# Patient Record
Sex: Female | Born: 1975
Health system: Southern US, Community
[De-identification: ages and names within clinical notes are randomized; demographics above are authoritative.]

## PROBLEM LIST (undated history)

## (undated) DIAGNOSIS — K219 Gastro-esophageal reflux disease without esophagitis: Secondary | ICD-10-CM

## (undated) DIAGNOSIS — T7840XA Allergy, unspecified, initial encounter: Secondary | ICD-10-CM

## (undated) DIAGNOSIS — G4700 Insomnia, unspecified: Secondary | ICD-10-CM

## (undated) DIAGNOSIS — R519 Headache, unspecified: Secondary | ICD-10-CM

## (undated) DIAGNOSIS — R011 Cardiac murmur, unspecified: Secondary | ICD-10-CM

## (undated) DIAGNOSIS — F419 Anxiety disorder, unspecified: Secondary | ICD-10-CM

## (undated) HISTORY — DX: Insomnia, unspecified: G47.00

## (undated) HISTORY — DX: Allergy, unspecified, initial encounter: T78.40XA

## (undated) HISTORY — DX: Anxiety disorder, unspecified: F41.9

---

## 2012-06-21 DIAGNOSIS — G4700 Insomnia, unspecified: Secondary | ICD-10-CM

## 2012-06-21 DIAGNOSIS — F419 Anxiety disorder, unspecified: Secondary | ICD-10-CM

## 2012-06-21 HISTORY — DX: Anxiety disorder, unspecified: F41.9

## 2012-06-21 HISTORY — DX: Insomnia, unspecified: G47.00

## 2016-10-04 ENCOUNTER — Ambulatory Visit (INDEPENDENT_AMBULATORY_CARE_PROVIDER_SITE_OTHER): Payer: 59 | Admitting: Internal Medicine

## 2016-10-04 ENCOUNTER — Encounter: Payer: Self-pay | Admitting: Internal Medicine

## 2016-10-04 VITALS — BP 128/62 | HR 96 | Ht 64.96 in | Wt 250.2 lb

## 2016-10-04 DIAGNOSIS — R0789 Other chest pain: Secondary | ICD-10-CM

## 2016-10-04 DIAGNOSIS — Z1231 Encounter for screening mammogram for malignant neoplasm of breast: Secondary | ICD-10-CM | POA: Diagnosis not present

## 2016-10-04 DIAGNOSIS — N920 Excessive and frequent menstruation with regular cycle: Secondary | ICD-10-CM

## 2016-10-04 DIAGNOSIS — J3089 Other allergic rhinitis: Secondary | ICD-10-CM

## 2016-10-04 DIAGNOSIS — Z1239 Encounter for other screening for malignant neoplasm of breast: Secondary | ICD-10-CM

## 2016-10-04 NOTE — Patient Instructions (Signed)
Breast Self-Awareness Breast self-awareness means being familiar with how your breasts look and feel. It involves checking your breasts regularly and reporting any changes to your health care provider. Practicing breast self-awareness is important. A change in your breasts can be a sign of a serious medical problem. Being familiar with how your breasts look and feel allows you to find any problems early, when treatment is more likely to be successful. All women should practice breast self-awareness, including women who have had breast implants. How to do a breast self-exam One way to learn what is normal for your breasts and whether your breasts are changing is to do a breast self-exam. To do a breast self-exam: Look for Changes   1. Remove all the clothing above your waist. 2. Stand in front of a mirror in a room with good lighting. 3. Put your hands on your hips. 4. Push your hands firmly downward. 5. Compare your breasts in the mirror. Look for differences between them (asymmetry), such as:  Differences in shape.  Differences in size.  Puckers, dips, and bumps in one breast and not the other. 6. Look at each breast for changes in your skin, such as:  Redness.  Scaly areas. 7. Look for changes in your nipples, such as:  Discharge.  Bleeding.  Dimpling.  Redness.  A change in position. Feel for Changes   Carefully feel your breasts for lumps and changes. It is best to do this while lying on your back on the floor and again while sitting or standing in the shower or tub with soapy water on your skin. Feel each breast in the following way:  Place the arm on the side of the breast you are examining above your head.  Feel your breast with the other hand.  Start in the nipple area and make  inch (2 cm) overlapping circles to feel your breast. Use the pads of your three middle fingers to do this. Apply light pressure, then medium pressure, then firm pressure. The light pressure  will allow you to feel the tissue closest to the skin. The medium pressure will allow you to feel the tissue that is a little deeper. The firm pressure will allow you to feel the tissue close to the ribs.  Continue the overlapping circles, moving downward over the breast until you feel your ribs below your breast.  Move one finger-width toward the center of the body. Continue to use the  inch (2 cm) overlapping circles to feel your breast as you move slowly up toward your collarbone.  Continue the up and down exam using all three pressures until you reach your armpit. Write Down What You Find   Write down what is normal for each breast and any changes that you find. Keep a written record with breast changes or normal findings for each breast. By writing this information down, you do not need to depend only on memory for size, tenderness, or location. Write down where you are in your menstrual cycle, if you are still menstruating. If you are having trouble noticing differences in your breasts, do not get discouraged. With time you will become more familiar with the variations in your breasts and more comfortable with the exam. How often should I examine my breasts? Examine your breasts every month. If you are breastfeeding, the best time to examine your breasts is after a feeding or after using a breast pump. If you menstruate, the best time to examine your breasts is 5-7 days  after your period is over. During your period, your breasts are lumpier, and it may be more difficult to notice changes. When should I see my health care provider? See your health care provider if you notice:  A change in shape or size of your breasts or nipples.  A change in the skin of your breast or nipples, such as a reddened or scaly area.  Unusual discharge from your nipples.  A lump or thick area that was not there before.  Pain in your breasts.  Anything that concerns you. This information is not intended to  replace advice given to you by your health care provider. Make sure you discuss any questions you have with your health care provider. Document Released: 06/07/2005 Document Revised: 11/13/2015 Document Reviewed: 04/27/2015 Elsevier Interactive Patient Education  2017 [employer].

## 2016-10-04 NOTE — Progress Notes (Signed)
Date:  10/04/2016   Name:  Kathryn George   DOB:  [birth year]   MRN:  [id number]   Chief Complaint: [place] (Moved here from [place] 2 years ago. Needs PCP.) and Chest Pain (2 weeks ago this started. Wakes up in middle of night with chest tightness, SOB, and feels like "somebody punched me in my chest. I feel startled." Throat burns in middle of night.  ) Chest Pain   This is a new problem. The current episode started 1 to 4 weeks ago. The onset quality is sudden. The problem has been resolved. The pain is present in the substernal region. The pain is moderate. The quality of the pain is described as crushing. The pain does not radiate. Associated symptoms include shortness of breath. Pertinent negatives include no abdominal pain, cough, diaphoresis, dizziness, exertional chest pressure, fever, headaches, irregular heartbeat, palpitations or vomiting. Associated with: lying down. She has tried nothing for the symptoms.  Her family medical history is significant for diabetes.  Pertinent negatives for family medical history include: no CAD.  Menstrual change - recently having heavier menses with some clots.  Menses are still mostly regular but she has not kept a calendar.  Bleeding lasts 5 days as usual.  No currently sexually active - no concerned about birth control.    Review of Systems  Constitutional: Negative for diaphoresis and fever.  Eyes: Negative for visual disturbance.  Respiratory: Positive for shortness of breath. Negative for cough.   Cardiovascular: Positive for chest pain. Negative for palpitations.  Gastrointestinal: Negative for abdominal pain, diarrhea and vomiting.  Genitourinary: Positive for menstrual problem. Negative for dysuria.  Musculoskeletal: Negative for arthralgias and gait problem.  Skin: Negative for color change and rash.  Neurological: Negative for dizziness and headaches.    There are no active problems to display for this patient.   Prior to  Admission medications   Medication Sig Start Date End Date Taking? Authorizing Provider  Naproxen Sod-Diphenhydramine (ALEVE PM PO) Take by mouth.   Yes Historical Provider, MD    No Known Allergies  History reviewed. No pertinent surgical history.  Social History  Substance Use Topics  . Smoking status: Current Every Day Smoker    Types: Cigars  . Smokeless tobacco: Never Used     Comment: started cigars 2 years ago.  . Alcohol use Yes     Medication list has been reviewed and updated.   Physical Exam  Constitutional: She is oriented to person, place, and time. She appears well-developed. No distress.  HENT:  Head: Normocephalic and atraumatic.  Right Ear: Tympanic membrane and ear canal normal.  Left Ear: Tympanic membrane and ear canal normal.  Nose: Right sinus exhibits no maxillary sinus tenderness. Left sinus exhibits no maxillary sinus tenderness.  Mouth/Throat: No posterior oropharyngeal erythema.  Eyes: Conjunctivae and EOM are normal. Pupils are equal, round, and reactive to light.  Neck: Normal range of motion. Carotid bruit is not present. No thyromegaly present.  Cardiovascular: Normal rate, regular rhythm, normal heart sounds and intact distal pulses.   Pulmonary/Chest: Effort normal and breath sounds normal. No respiratory distress. She has no wheezes.  Abdominal: Soft. Bowel sounds are normal. There is no tenderness.  Musculoskeletal: She exhibits no edema.  Neurological: She is alert and oriented to person, place, and time. She has normal reflexes.  Skin: Skin is warm and dry. No rash noted.  Psychiatric: She has a normal mood and affect. Her behavior is normal. Thought content normal.  Nursing note and vitals reviewed.   BP 128/62 (BP Location: Right Arm, Patient Position: Sitting, Cuff Size: Large)   Pulse 96   Ht 5' 4.96" (1.65 m)   Wt 250 lb 3.2 oz (113.5 kg)   LMP 09/25/2016 (Exact Date)   SpO2 99%   BMI 41.69 kg/m   Assessment and Plan: 1.  Atypical chest pain Suspect GERD Anti-reflux precautions given  Take 14 day course of Prilosec if sx recur  2. Menorrhagia with regular cycle Likely peri-menopausal Pap done last year was normal by report  3. Breast cancer screening - MM DIGITAL SCREENING BILATERAL; Future  4. Environmental and seasonal allergies Continue Claritin or Zyrtec   No orders of the defined types were placed in this encounter.   Halina Maidens, MD [facility]  10/04/2016

## 2017-04-11 ENCOUNTER — Encounter: Payer: 59 | Admitting: Internal Medicine

## 2017-04-15 ENCOUNTER — Encounter: Payer: Self-pay | Admitting: Internal Medicine

## 2017-04-15 ENCOUNTER — Ambulatory Visit (INDEPENDENT_AMBULATORY_CARE_PROVIDER_SITE_OTHER): Payer: 59 | Admitting: Internal Medicine

## 2017-04-15 VITALS — BP 104/78 | HR 67 | Ht 64.9 in | Wt 252.0 lb

## 2017-04-15 DIAGNOSIS — Z0000 Encounter for general adult medical examination without abnormal findings: Secondary | ICD-10-CM | POA: Diagnosis not present

## 2017-04-15 DIAGNOSIS — Z1231 Encounter for screening mammogram for malignant neoplasm of breast: Secondary | ICD-10-CM

## 2017-04-15 DIAGNOSIS — E559 Vitamin D deficiency, unspecified: Secondary | ICD-10-CM

## 2017-04-15 DIAGNOSIS — N920 Excessive and frequent menstruation with regular cycle: Secondary | ICD-10-CM | POA: Diagnosis not present

## 2017-04-15 DIAGNOSIS — Z1239 Encounter for other screening for malignant neoplasm of breast: Secondary | ICD-10-CM

## 2017-04-15 LAB — POCT URINALYSIS DIPSTICK
BILIRUBIN UA: NEGATIVE
GLUCOSE UA: NEGATIVE
Ketones, UA: NEGATIVE
Leukocytes, UA: NEGATIVE
Nitrite, UA: NEGATIVE
Protein, UA: 0.2
SPEC GRAV UA: 1.015 (ref 1.010–1.025)
Urobilinogen, UA: 0.2 E.U./dL
pH, UA: 7 (ref 5.0–8.0)

## 2017-04-15 NOTE — Patient Instructions (Signed)
DASH Eating Plan - although this is for high BP it is an excellent diet in general DASH stands for "Dietary Approaches to Stop Hypertension." The DASH eating plan is a healthy eating plan that has been shown to reduce high blood pressure (hypertension). It may also reduce your risk for type 2 diabetes, heart disease, and stroke. The DASH eating plan may also help with weight loss. What are tips for following this plan? General guidelines  Avoid eating more than 2,300 mg (milligrams) of salt (sodium) a day. If you have hypertension, you may need to reduce your sodium intake to 1,500 mg a day.  Limit alcohol intake to no more than 1 drink a day for nonpregnant women and 2 drinks a day for men. One drink equals 12 oz of beer, 5 oz of wine, or 1 oz of hard liquor.  Work with your health care provider to maintain a healthy body weight or to lose weight. Ask what an ideal weight is for you.  Get at least 30 minutes of exercise that causes your heart to beat faster (aerobic exercise) most days of the week. Activities may include walking, swimming, or biking.  Work with your health care provider or diet and nutrition specialist (dietitian) to adjust your eating plan to your individual calorie needs. Reading food labels  Check food labels for the amount of sodium per serving. Choose foods with less than 5 percent of the Daily Value of sodium. Generally, foods with less than 300 mg of sodium per serving fit into this eating plan.  To find whole grains, look for the word "whole" as the first word in the ingredient list. Shopping  Buy products labeled as "low-sodium" or "no salt added."  Buy fresh foods. Avoid canned foods and premade or frozen meals. Cooking  Avoid adding salt when cooking. Use salt-free seasonings or herbs instead of table salt or sea salt. Check with your health care provider or pharmacist before using salt substitutes.  Do not fry foods. Cook foods using healthy methods such as  baking, boiling, grilling, and broiling instead.  Cook with heart-healthy oils, such as olive, canola, soybean, or sunflower oil. Meal planning   Eat a balanced diet that includes: ? 5 or more servings of fruits and vegetables each day. At each meal, try to fill half of your plate with fruits and vegetables. ? Up to 6-8 servings of whole grains each day. ? Less than 6 oz of lean meat, poultry, or fish each day. A 3-oz serving of meat is about the same size as a deck of cards. One egg equals 1 oz. ? 2 servings of low-fat dairy each day. ? A serving of nuts, seeds, or beans 5 times each week. ? Heart-healthy fats. Healthy fats called Omega-3 fatty acids are found in foods such as flaxseeds and coldwater fish, like sardines, salmon, and mackerel.  Limit how much you eat of the following: ? Canned or prepackaged foods. ? Food that is high in trans fat, such as fried foods. ? Food that is high in saturated fat, such as fatty meat. ? Sweets, desserts, sugary drinks, and other foods with added sugar. ? Full-fat dairy products.  Do not salt foods before eating.  Try to eat at least 2 vegetarian meals each week.  Eat more home-cooked food and less restaurant, buffet, and fast food.  When eating at a restaurant, ask that your food be prepared with less salt or no salt, if possible. What foods are recommended? The  items listed may not be a complete list. Talk with your dietitian about what dietary choices are best for you. Grains Whole-grain or whole-wheat bread. Whole-grain or whole-wheat pasta. Brown rice. [place]. Bulgur. Whole-grain and low-sodium cereals. Pita bread. Low-fat, low-sodium crackers. Whole-wheat flour tortillas. Vegetables Fresh or frozen vegetables (raw, steamed, roasted, or grilled). Low-sodium or reduced-sodium tomato and vegetable juice. Low-sodium or reduced-sodium tomato sauce and tomato paste. Low-sodium or reduced-sodium canned vegetables. Fruits All fresh,  dried, or frozen fruit. Canned fruit in natural juice (without added sugar). Meat and other protein foods Skinless chicken or [place]. Ground chicken or [place]. Pork with fat trimmed off. Fish and seafood. Egg whites. Dried beans, peas, or lentils. Unsalted nuts, nut butters, and seeds. Unsalted canned beans. Lean cuts of beef with fat trimmed off. Low-sodium, lean deli meat. Dairy Low-fat (1%) or fat-free (skim) milk. Fat-free, low-fat, or reduced-fat cheeses. Nonfat, low-sodium ricotta or cottage cheese. Low-fat or nonfat yogurt. Low-fat, low-sodium cheese. Fats and oils Soft margarine without trans fats. Vegetable oil. Low-fat, reduced-fat, or light mayonnaise and salad dressings (reduced-sodium). Canola, safflower, olive, soybean, and sunflower oils. Avocado. Seasoning and other foods Herbs. Spices. Seasoning mixes without salt. Unsalted popcorn and pretzels. Fat-free sweets. What foods are not recommended? The items listed may not be a complete list. Talk with your dietitian about what dietary choices are best for you. Grains Baked goods made with fat, such as croissants, muffins, or some breads. Dry pasta or rice meal packs. Vegetables Creamed or fried vegetables. Vegetables in a cheese sauce. Regular canned vegetables (not low-sodium or reduced-sodium). Regular canned tomato sauce and paste (not low-sodium or reduced-sodium). Regular tomato and vegetable juice (not low-sodium or reduced-sodium). Angie Fava. Olives. Fruits Canned fruit in a light or heavy syrup. Fried fruit. Fruit in cream or butter sauce. Meat and other protein foods Fatty cuts of meat. Ribs. Fried meat. Berniece Salines. Sausage. Bologna and other processed lunch meats. Salami. Fatback. Hotdogs. Bratwurst. Salted nuts and seeds. Canned beans with added salt. Canned or smoked fish. Whole eggs or egg yolks. Chicken or [place] with skin. Dairy Whole or 2% milk, cream, and half-and-half. Whole or full-fat cream cheese. Whole-fat or sweetened  yogurt. Full-fat cheese. Nondairy creamers. Whipped toppings. Processed cheese and cheese spreads. Fats and oils Butter. Stick margarine. Lard. Shortening. Ghee. Bacon fat. Tropical oils, such as coconut, palm kernel, or palm oil. Seasoning and other foods Salted popcorn and pretzels. Onion salt, garlic salt, seasoned salt, table salt, and sea salt. Worcestershire sauce. Tartar sauce. Barbecue sauce. Teriyaki sauce. Soy sauce, including reduced-sodium. Steak sauce. Canned and packaged gravies. Fish sauce. Oyster sauce. Cocktail sauce. Horseradish that you find on the shelf. Ketchup. Mustard. Meat flavorings and tenderizers. Bouillon cubes. Hot sauce and Tabasco sauce. Premade or packaged marinades. Premade or packaged taco seasonings. Relishes. Regular salad dressings. Where to find more information:  National Heart, Lung, and [place]: https://wilson-eaton.com/  American Heart Association: www.heart.org Summary  The DASH eating plan is a healthy eating plan that has been shown to reduce high blood pressure (hypertension). It may also reduce your risk for type 2 diabetes, heart disease, and stroke.  With the DASH eating plan, you should limit salt (sodium) intake to 2,300 mg a day. If you have hypertension, you may need to reduce your sodium intake to 1,500 mg a day.  When on the DASH eating plan, aim to eat more fresh fruits and vegetables, whole grains, lean proteins, low-fat dairy, and heart-healthy fats.  Work with your health care provider  or diet and nutrition specialist (dietitian) to adjust your eating plan to your individual calorie needs. This information is not intended to replace advice given to you by your health care provider. Make sure you discuss any questions you have with your health care provider. Document Released: 05/27/2011 Document Revised: 05/31/2016 Document Reviewed: 05/31/2016 Elsevier Interactive Patient Education  2017 [employer].

## 2017-04-15 NOTE — Progress Notes (Signed)
Date:  04/15/2017   Name:  Kathryn George   DOB:  [birth year]   MRN:  [id number]   Chief Complaint: Annual Exam (Breast Exam. ) Kathryn George is a 41 y.o. female who presents today for her Complete Annual Exam. She feels well. She reports exercising regularly. She reports she is sleeping well. Her menses are still fairly regular but somewhat heavier and slightly longer - up to 7 days. Mammogram was done in April. Patient reports a history of vitamin D deficiency but has not taken supplements in over a year.  She has a history of mild anemia in the past but none recently.  EXAM: MAMMO SCREENING BILATERAL DATE: 10/15/2016 11:48 AM ACCESSION: [id number] UN DICTATED: 10/15/2016 9:58 PM INTERPRETATION LOCATION: [place]  CLINICAL INDICATION: 41 years old Female: SCREENING MAMMOGRAM-Z12.31-Visit for screening mammogram  TECHNIQUE: Full-field digital mammography with MLO and CC views  COMPARISON: 2016  BREAST DENSITY: b - There are scattered areas of fibroglandular density.  FINDINGS: There are no suspicious masses, malignant calcifications, sites of architectural distortion, or concerning asymmetries in either breast.  ASSESSMENT:  BI-RADS Category: 1-Mammo1Yr : Negative.Mammogram 1 year follow-up.   Recommendation Laterality: Both  Annual routine screening mammography.  Review of Systems  Constitutional: Negative for chills, fatigue and fever.  HENT: Negative for congestion, hearing loss, tinnitus, trouble swallowing and voice change.   Eyes: Negative for visual disturbance.  Respiratory: Negative for cough, chest tightness, shortness of breath and wheezing.   Cardiovascular: Negative for chest pain, palpitations and leg swelling.  Gastrointestinal: Negative for abdominal pain, constipation, diarrhea and vomiting.       Intermittent gerd  Endocrine: Negative for polydipsia and polyuria.  Genitourinary: Negative for dysuria, frequency, genital sores, vaginal bleeding and  vaginal discharge.  Musculoskeletal: Negative for arthralgias, gait problem and joint swelling.  Skin: Negative for color change and rash.  Neurological: Negative for dizziness, tremors, light-headedness and headaches.  Hematological: Negative for adenopathy. Does not bruise/bleed easily.  Psychiatric/Behavioral: Negative for dysphoric mood and sleep disturbance. The patient is not nervous/anxious.     Patient Active Problem List   Diagnosis Date Noted  . Atypical chest pain 10/04/2016  . Menorrhagia with regular cycle 10/04/2016  . Environmental and seasonal allergies 10/04/2016    Prior to Admission medications   Medication Sig Start Date End Date Taking? Authorizing Provider  desonide (DESOWEN) 0.05 % cream Apply topically 2 (two) times daily.   Yes [provider]  Naproxen Sod-Diphenhydramine (ALEVE PM PO) Take by mouth.   Yes [provider]    No Known Allergies  History reviewed. No pertinent surgical history.  Social History  Substance Use Topics  . Smoking status: Current Every Day Smoker    Types: Cigars  . Smokeless tobacco: Never Used     Comment: started cigars 2 years ago.  . Alcohol use Yes     Medication list has been reviewed and updated.  PHQ 2/9 Scores 04/15/2017  PHQ - 2 Score 0    Physical Exam  Constitutional: She is oriented to person, place, and time. She appears well-developed and well-nourished. No distress.  HENT:  Head: Normocephalic and atraumatic.  Right Ear: Tympanic membrane and ear canal normal.  Left Ear: Tympanic membrane and ear canal normal.  Nose: Right sinus exhibits no maxillary sinus tenderness. Left sinus exhibits no maxillary sinus tenderness.  Mouth/Throat: Uvula is midline and oropharynx is clear and moist.  Eyes: Conjunctivae and EOM are normal. Right eye exhibits no discharge. Left eye exhibits  no discharge. No scleral icterus.  Neck: Normal range of motion. Carotid bruit is not present. No erythema  present. No thyromegaly present.  Cardiovascular: Normal rate, regular rhythm, normal heart sounds and normal pulses.   Pulmonary/Chest: Effort normal. No respiratory distress. She has no wheezes. Right breast exhibits no mass, no nipple discharge, no skin change and no tenderness. Left breast exhibits no mass, no nipple discharge, no skin change and no tenderness.  Abdominal: Soft. Bowel sounds are normal. There is no hepatosplenomegaly. There is no tenderness. There is no CVA tenderness.  Musculoskeletal: Normal range of motion.  Lymphadenopathy:    She has no cervical adenopathy.    She has no axillary adenopathy.  Neurological: She is alert and oriented to person, place, and time. She has normal reflexes. No cranial nerve deficit or sensory deficit.  Skin: Skin is warm, dry and intact. No rash noted.  Psychiatric: She has a normal mood and affect. Her speech is normal and behavior is normal. Thought content normal.  Nursing note and vitals reviewed.   BP 104/78   Pulse 67   Ht 5' 4.9" (1.648 m)   Wt 252 lb (114.3 kg)   LMP 04/10/2017 (Exact Date)   SpO2 98%   BMI 42.06 kg/m   Assessment and Plan: 1. Annual physical exam Normal exam except for weight - work on healthy diet and exercise - Comprehensive metabolic panel - Lipid panel - TSH - POCT urinalysis dipstick  2. Breast cancer screening Completed with normal exam  3. Menorrhagia with regular cycle Pap done 2017 - CBC with Differential/Platelet  4. Vitamin D deficiency May need to resume supplements - VITAMIN D 25 Hydroxy (Vit-D Deficiency, Fractures)   No orders of the defined types were placed in this encounter.   Partially dictated using Editor, commissioning. Any errors are unintentional.  Halina Maidens, MD [facility]  04/15/2017

## 2017-04-16 LAB — CBC WITH DIFFERENTIAL/PLATELET
BASOS ABS: 0.1 10*3/uL (ref 0.0–0.2)
BASOS: 1 %
EOS (ABSOLUTE): 0.2 10*3/uL (ref 0.0–0.4)
EOS: 3 %
HEMATOCRIT: 35.7 % (ref 34.0–46.6)
HEMOGLOBIN: 11.3 g/dL (ref 11.1–15.9)
IMMATURE GRANS (ABS): 0 10*3/uL (ref 0.0–0.1)
Immature Granulocytes: 0 %
LYMPHS: 31 %
Lymphocytes Absolute: 1.8 10*3/uL (ref 0.7–3.1)
MCH: 23.7 pg — AB (ref 26.6–33.0)
MCHC: 31.7 g/dL (ref 31.5–35.7)
MCV: 75 fL — ABNORMAL LOW (ref 79–97)
MONOCYTES: 12 %
Monocytes Absolute: 0.7 10*3/uL (ref 0.1–0.9)
NEUTROS ABS: 3.2 10*3/uL (ref 1.4–7.0)
NEUTROS PCT: 53 %
Platelets: 369 10*3/uL (ref 150–379)
RBC: 4.77 x10E6/uL (ref 3.77–5.28)
RDW: 15.7 % — ABNORMAL HIGH (ref 12.3–15.4)
WBC: 5.9 10*3/uL (ref 3.4–10.8)

## 2017-04-16 LAB — COMPREHENSIVE METABOLIC PANEL
ALBUMIN: 4 g/dL (ref 3.5–5.5)
ALT: 17 IU/L (ref 0–32)
AST: 16 IU/L (ref 0–40)
Albumin/Globulin Ratio: 1.2 (ref 1.2–2.2)
Alkaline Phosphatase: 71 IU/L (ref 39–117)
BILIRUBIN TOTAL: 0.2 mg/dL (ref 0.0–1.2)
BUN / CREAT RATIO: 15 (ref 9–23)
BUN: 12 mg/dL (ref 6–24)
CO2: 23 mmol/L (ref 20–29)
CREATININE: 0.8 mg/dL (ref 0.57–1.00)
Calcium: 9 mg/dL (ref 8.7–10.2)
Chloride: 100 mmol/L (ref 96–106)
GFR calc non Af Amer: 91 mL/min/{1.73_m2} (ref >59)
GFR, EST AFRICAN AMERICAN: 104 mL/min/{1.73_m2} (ref >59)
GLOBULIN, TOTAL: 3.4 g/dL (ref 1.5–4.5)
GLUCOSE: 79 mg/dL (ref 65–99)
Potassium: 4.4 mmol/L (ref 3.5–5.2)
SODIUM: 136 mmol/L (ref 134–144)
TOTAL PROTEIN: 7.4 g/dL (ref 6.0–8.5)

## 2017-04-16 LAB — LIPID PANEL
Chol/HDL Ratio: 4.3 ratio (ref 0.0–4.4)
Cholesterol, Total: 233 mg/dL — ABNORMAL HIGH (ref 100–199)
HDL: 54 mg/dL (ref >39)
LDL CALC: 141 mg/dL — AB (ref 0–99)
Triglycerides: 188 mg/dL — ABNORMAL HIGH (ref 0–149)
VLDL CHOLESTEROL CAL: 38 mg/dL (ref 5–40)

## 2017-04-16 LAB — TSH: TSH: 0.675 u[IU]/mL (ref 0.450–4.500)

## 2017-04-16 LAB — VITAMIN D 25 HYDROXY (VIT D DEFICIENCY, FRACTURES): VIT D 25 HYDROXY: 16.4 ng/mL — AB (ref 30.0–100.0)

## 2017-04-17 ENCOUNTER — Encounter: Payer: Self-pay | Admitting: Internal Medicine

## 2017-04-17 DIAGNOSIS — E782 Mixed hyperlipidemia: Secondary | ICD-10-CM | POA: Insufficient documentation

## 2017-11-10 ENCOUNTER — Encounter: Payer: 59 | Admitting: Internal Medicine

## 2017-12-09 ENCOUNTER — Encounter: Payer: Self-pay | Admitting: Internal Medicine

## 2018-08-25 ENCOUNTER — Encounter: Payer: Self-pay | Admitting: Internal Medicine

## 2018-08-25 ENCOUNTER — Ambulatory Visit (INDEPENDENT_AMBULATORY_CARE_PROVIDER_SITE_OTHER): Payer: 59 | Admitting: Internal Medicine

## 2018-08-25 ENCOUNTER — Other Ambulatory Visit: Payer: Self-pay

## 2018-08-25 VITALS — BP 138/84 | HR 76 | Resp 16 | Ht 64.9 in | Wt 250.0 lb

## 2018-08-25 DIAGNOSIS — N6082 Other benign mammary dysplasias of left breast: Secondary | ICD-10-CM

## 2018-08-25 DIAGNOSIS — Z1231 Encounter for screening mammogram for malignant neoplasm of breast: Secondary | ICD-10-CM | POA: Diagnosis not present

## 2018-08-25 NOTE — Progress Notes (Signed)
Date:  08/25/2018   Name:  Kathryn George   DOB:  1975/10/01   MRN:  [id number]   Chief Complaint: Breast Problem (lump on left breast looked like a pimple at first and nowm moves and no longer hurts. Family PGM had breast cancer. )  Mass - pt noted a lump on her inner left breast about 6 months ago.  It sometimes seems to move but has not changed in size,  It was originally slightly tender but is no longer.  No drainage or odor, no trauma.  She is overdue for a mammogram.  Her mother had breast cancer.  Review of Systems  Constitutional: Negative for chills and fatigue.  Respiratory: Negative for chest tightness and shortness of breath.   Cardiovascular: Negative for chest pain.  Skin: Negative for color change and rash.    Patient Active Problem List   Diagnosis Date Noted  . Hyperlipidemia, mixed 04/17/2017  . Atypical chest pain 10/04/2016  . Menorrhagia with regular cycle 10/04/2016  . Environmental and seasonal allergies 10/04/2016    No Known Allergies  History reviewed. No pertinent surgical history.  Social History   Tobacco Use  . Smoking status: Light Tobacco Smoker    Types: Cigars  . Smokeless tobacco: Never Used  . Tobacco comment: started cigars 2 years ago.  Substance Use Topics  . Alcohol use: Yes  . Drug use: No     Medication list has been reviewed and updated.  Current Meds  Medication Sig  . desonide (DESOWEN) 0.05 % cream Apply topically 2 (two) times daily.  . Naproxen Sod-Diphenhydramine (ALEVE PM PO) Take by mouth.    PHQ 2/9 Scores 04/15/2017  PHQ - 2 Score 0    Physical Exam Vitals signs and nursing note reviewed.  Constitutional:      General: She is not in acute distress.    Appearance: She is well-developed.  HENT:     Head: Normocephalic and atraumatic.  Neck:     Musculoskeletal: Normal range of motion and neck supple.  Cardiovascular:     Rate and Rhythm: Normal rate and regular rhythm.  Pulmonary:     Effort:  Pulmonary effort is normal. No respiratory distress.     Breath sounds: Normal breath sounds.  Chest:     Breasts:        Right: No inverted nipple, mass, nipple discharge, skin change or tenderness.        Left: No inverted nipple, mass, nipple discharge, skin change or tenderness.    Musculoskeletal: Normal range of motion.  Skin:    General: Skin is warm and dry.     Findings: No rash.  Neurological:     Mental Status: She is alert and oriented to person, place, and time.  Psychiatric:        Behavior: Behavior normal.        Thought Content: Thought content normal.     BP (!) 138/102   Pulse 76   Resp 16   Ht 5' 4.9" (1.648 m)   Wt 250 lb (113.4 kg)   LMP 08/22/2018   SpO2 97%   BMI 41.73 kg/m   Assessment and Plan: 1. Sebaceous cyst of skin of left breast Pt reassured, nothing needs to be done unless the cyst becomes larger, painful or infected  2. Encounter for screening mammogram for breast cancer Pt to schedule at [facility]; Future   Partially dictated using Editor, commissioning. Any  errors are unintentional.  Halina Maidens, MD [facility]  08/25/2018

## 2018-09-19 ENCOUNTER — Encounter: Payer: 59 | Admitting: Internal Medicine

## 2018-10-02 ENCOUNTER — Encounter: Payer: Self-pay | Admitting: Internal Medicine

## 2018-10-02 ENCOUNTER — Other Ambulatory Visit (HOSPITAL_COMMUNITY)
Admission: RE | Admit: 2018-10-02 | Discharge: 2018-10-02 | Disposition: A | Discharge status: Home / Self Care | Payer: 59 | Source: Ambulatory Visit | Attending: Internal Medicine | Admitting: Internal Medicine

## 2018-10-02 ENCOUNTER — Other Ambulatory Visit: Payer: Self-pay

## 2018-10-02 ENCOUNTER — Ambulatory Visit (INDEPENDENT_AMBULATORY_CARE_PROVIDER_SITE_OTHER): Payer: 59 | Admitting: Internal Medicine

## 2018-10-02 VITALS — BP 128/88 | HR 75 | Ht 64.9 in | Wt 254.0 lb

## 2018-10-02 DIAGNOSIS — Z1231 Encounter for screening mammogram for malignant neoplasm of breast: Secondary | ICD-10-CM | POA: Diagnosis not present

## 2018-10-02 DIAGNOSIS — Z124 Encounter for screening for malignant neoplasm of cervix: Secondary | ICD-10-CM | POA: Diagnosis not present

## 2018-10-02 DIAGNOSIS — Z0000 Encounter for general adult medical examination without abnormal findings: Secondary | ICD-10-CM

## 2018-10-02 LAB — POCT URINALYSIS DIPSTICK
Bilirubin, UA: NEGATIVE
Glucose, UA: NEGATIVE
Ketones, UA: NEGATIVE
Leukocytes, UA: NEGATIVE
Nitrite, UA: NEGATIVE
Protein, UA: NEGATIVE
Spec Grav, UA: 1.015 (ref 1.010–1.025)
Urobilinogen, UA: 0.2 E.U./dL
pH, UA: 6 (ref 5.0–8.0)

## 2018-10-02 MED ORDER — NAPROXEN 500 MG PO TBEC: 500.0000 mg | DELAYED_RELEASE_TABLET | Freq: Two times a day (BID) | ORAL | 5 refills | Status: DC

## 2018-10-02 NOTE — Progress Notes (Signed)
Date:  10/02/2018   Name:  Kathryn George   DOB:  Apr 23, 1976   MRN:  [id number]   Chief Complaint: Annual Exam (Breast and Pap. ) Kathryn George is a 43 y.o. female who presents today for her Complete Annual Exam. She feels well. She reports exercising some until the gym was closed. She reports she is not sleeping well unless she takes Aleve PM. Last Mammogram 08/2018 Last Pap 08/2015  HPI  Review of Systems  Constitutional: Negative for chills, fatigue and fever.  HENT: Negative for congestion, hearing loss, tinnitus, trouble swallowing and voice change.   Eyes: Negative for visual disturbance.  Respiratory: Negative for cough, chest tightness, shortness of breath and wheezing.   Cardiovascular: Negative for chest pain, palpitations and leg swelling.  Gastrointestinal: Negative for abdominal pain, constipation, diarrhea and vomiting.  Endocrine: Negative for polydipsia and polyuria.  Genitourinary: Positive for menstrual problem (menses irregular). Negative for dysuria, frequency, genital sores, vaginal bleeding and vaginal discharge.  Musculoskeletal: Negative for arthralgias, gait problem and joint swelling.  Skin: Negative for color change and rash.  Allergic/Immunologic: Positive for environmental allergies.  Neurological: Positive for headaches (occasional). Negative for dizziness, tremors and light-headedness.  Hematological: Negative for adenopathy. Does not bruise/bleed easily.  Psychiatric/Behavioral: Positive for sleep disturbance. Negative for dysphoric mood. The patient is not nervous/anxious.     Patient Active Problem List   Diagnosis Date Noted  . Sebaceous cyst of skin of left breast 08/25/2018  . Hyperlipidemia, mixed 04/17/2017  . Atypical chest pain 10/04/2016  . Menorrhagia with regular cycle 10/04/2016  . Environmental and seasonal allergies 10/04/2016    No Known Allergies  History reviewed. No pertinent surgical history.  Social History   Tobacco  Use  . Smoking status: Former Smoker    Types: Cigars    Last attempt to quit: 06/21/2017    Years since quitting: 1.2  . Smokeless tobacco: Never Used  . Tobacco comment: started cigars 2 years ago.  Substance Use Topics  . Alcohol use: Yes  . Drug use: No     Medication list has been reviewed and updated.  Current Meds  Medication Sig  . Naproxen Sod-Diphenhydramine (ALEVE PM PO) Take by mouth.    PHQ 2/9 Scores 10/02/2018 04/15/2017  PHQ - 2 Score 0 0    BP Readings from Last 3 Encounters:  10/02/18 128/88  08/25/18 138/84  04/15/17 104/78    Physical Exam Vitals signs and nursing note reviewed.  Constitutional:      General: She is not in acute distress.    Appearance: She is well-developed.  HENT:     Head: Normocephalic and atraumatic.     Right Ear: Tympanic membrane and ear canal normal.     Left Ear: Tympanic membrane and ear canal normal.     Nose:     Right Sinus: No maxillary sinus tenderness.     Left Sinus: No maxillary sinus tenderness.     Mouth/Throat:     Pharynx: Uvula midline.  Eyes:     General: No scleral icterus.       Right eye: No discharge.        Left eye: No discharge.     Conjunctiva/sclera: Conjunctivae normal.  Neck:     Musculoskeletal: Normal range of motion. No erythema.     Thyroid: No thyromegaly.     Vascular: No carotid bruit.  Cardiovascular:     Rate and Rhythm: Normal rate and regular rhythm.  Pulses: Normal pulses.     Heart sounds: Normal heart sounds.  Pulmonary:     Effort: Pulmonary effort is normal. No respiratory distress.     Breath sounds: No wheezing.  Chest:     Breasts:        Right: No mass, nipple discharge, skin change or tenderness.        Left: No mass, nipple discharge, skin change or tenderness.  Abdominal:     General: Bowel sounds are normal.     Palpations: Abdomen is soft.     Tenderness: There is no abdominal tenderness.  Genitourinary:    Labia:        Right: No tenderness, lesion  or injury.        Left: No tenderness, lesion or injury.      Vagina: Normal.     Cervix: Discharge (scant dark blood at os) present.     Uterus: Normal.      Adnexa: Right adnexa normal and left adnexa normal.  Musculoskeletal: Normal range of motion.  Lymphadenopathy:     Cervical: No cervical adenopathy.  Skin:    General: Skin is warm and dry.     Findings: No rash.  Neurological:     Mental Status: She is alert and oriented to person, place, and time.     Cranial Nerves: No cranial nerve deficit.     Sensory: No sensory deficit.     Deep Tendon Reflexes: Reflexes are normal and symmetric.  Psychiatric:        Attention and Perception: Attention normal.        Mood and Affect: Mood normal.        Speech: Speech normal.        Behavior: Behavior normal.        Thought Content: Thought content normal.     Wt Readings from Last 3 Encounters:  10/02/18 254 lb (115.2 kg)  08/25/18 250 lb (113.4 kg)  04/15/17 252 lb (114.3 kg)    BP 128/88   Pulse 75   Ht 5' 4.9" (1.648 m)   Wt 254 lb (115.2 kg)   LMP 09/28/2018 (Exact Date)   SpO2 95%   BMI 42.40 kg/m   Assessment and Plan: 1. Annual physical exam Normal exam except for weight - work on diet and exercise - CBC with Differential/Platelet - Comprehensive metabolic panel - Hemoglobin A1c - Lipid panel - TSH - POCT urinalysis dipstick  2. Encounter for screening mammogram for breast cancer Done at [facility] last month  3. Encounter for Papanicolaou smear for cervical cancer screening Obtained today - Cytology - PAP   Partially dictated using Dragon software. Any errors are unintentional.  Halina Maidens, MD [facility]  10/02/2018

## 2018-10-02 NOTE — Patient Instructions (Addendum)
Health Maintenance, Female Adopting a healthy lifestyle and getting preventive care can go a long way to promote health and wellness. Talk with your health care provider about what schedule of regular examinations is right for you. This is a good chance for you to check in with your provider about disease prevention and staying healthy. In between checkups, there are plenty of things you can do on your own. Experts have done a lot of research about which lifestyle changes and preventive measures are most likely to keep you healthy. Ask your health care provider for more information. Weight and diet Eat a healthy diet  Be sure to include plenty of vegetables, fruits, low-fat dairy products, and lean protein.  Do not eat a lot of foods high in solid fats, added sugars, or salt.  Get regular exercise. This is one of the most important things you can do for your health. ? Most adults should exercise for at least 150 minutes each week. The exercise should increase your heart rate and make you sweat (moderate-intensity exercise). ? Most adults should also do strengthening exercises at least twice a week. This is in addition to the moderate-intensity exercise. Maintain a healthy weight  Body mass index (BMI) is a measurement that can be used to identify possible weight problems. It estimates body fat based on height and weight. Your health care provider can help determine your BMI and help you achieve or maintain a healthy weight.  For females 20 years of age and older: ? A BMI below 18.5 is considered underweight. ? A BMI of 18.5 to 24.9 is normal. ? A BMI of 25 to 29.9 is considered overweight. ? A BMI of 30 and above is considered obese. Watch levels of cholesterol and blood lipids  You should start having your blood tested for lipids and cholesterol at 45 years of age, then have this test every 5 years.  You may need to have your cholesterol levels checked more often if: ? Your lipid or  cholesterol levels are high. ? You are older than 45 years of age. ? You are at high risk for heart disease. Cancer screening Lung Cancer  Lung cancer screening is recommended for adults 55-80 years old who are at high risk for lung cancer because of a history of smoking.  A yearly low-dose CT scan of the lungs is recommended for people who: ? Currently smoke. ? Have quit within the past 15 years. ? Have at least a 30-pack-year history of smoking. A pack year is smoking an average of one pack of cigarettes a day for 1 year.  Yearly screening should continue until it has been 15 years since you quit.  Yearly screening should stop if you develop a health problem that would prevent you from having lung cancer treatment. Breast Cancer  Practice breast self-awareness. This means understanding how your breasts normally appear and feel.  It also means doing regular breast self-exams. Let your health care provider know about any changes, no matter how small.  If you are in your 20s or 30s, you should have a clinical breast exam (CBE) by a health care provider every 1-3 years as part of a regular health exam.  If you are 40 or older, have a CBE every year. Also consider having a breast X-ray (mammogram) every year.  If you have a family history of breast cancer, talk to your health care provider about genetic screening.  If you are at high risk for breast cancer, talk   to your health care provider about having an MRI and a mammogram every year.  Breast cancer gene (BRCA) assessment is recommended for women who have family members with BRCA-related cancers. BRCA-related cancers include: ? Breast. ? Ovarian. ? Tubal. ? Peritoneal cancers.  Results of the assessment will determine the need for genetic counseling and BRCA1 and BRCA2 testing. Cervical Cancer Your health care provider may recommend that you be screened regularly for cancer of the pelvic organs (ovaries, uterus, and vagina).  This screening involves a pelvic examination, including checking for microscopic changes to the surface of your cervix (Pap test). You may be encouraged to have this screening done every 3 years, beginning at age 21.  For women ages 30-65, health care providers may recommend pelvic exams and Pap testing every 3 years, or they may recommend the Pap and pelvic exam, combined with testing for human papilloma virus (HPV), every 5 years. Some types of HPV increase your risk of cervical cancer. Testing for HPV may also be done on women of any age with unclear Pap test results.  Other health care providers may not recommend any screening for nonpregnant women who are considered low risk for pelvic cancer and who do not have symptoms. Ask your health care provider if a screening pelvic exam is right for you.  If you have had past treatment for cervical cancer or a condition that could lead to cancer, you need Pap tests and screening for cancer for at least 20 years after your treatment. If Pap tests have been discontinued, your risk factors (such as having a new sexual partner) need to be reassessed to determine if screening should resume. Some women have medical problems that increase the chance of getting cervical cancer. In these cases, your health care provider may recommend more frequent screening and Pap tests. Colorectal Cancer  This type of cancer can be detected and often prevented.  Routine colorectal cancer screening usually begins at 45 years of age and continues through 45 years of age.  Your health care provider may recommend screening at an earlier age if you have risk factors for colon cancer.  Your health care provider may also recommend using home test kits to check for hidden blood in the stool.  A small camera at the end of a tube can be used to examine your colon directly (sigmoidoscopy or colonoscopy). This is done to check for the earliest forms of colorectal cancer.  Routine  screening usually begins at age 50.  Direct examination of the colon should be repeated every 5-10 years through 45 years of age. However, you may need to be screened more often if early forms of precancerous polyps or small growths are found. Skin Cancer  Check your skin from head to toe regularly.  Tell your health care provider about any new moles or changes in moles, especially if there is a change in a mole's shape or color.  Also tell your health care provider if you have a mole that is larger than the size of a pencil eraser.  Always use sunscreen. Apply sunscreen liberally and repeatedly throughout the day.  Protect yourself by wearing long sleeves, pants, a wide-brimmed hat, and sunglasses whenever you are outside. Heart disease, diabetes, and high blood pressure  High blood pressure causes heart disease and increases the risk of stroke. High blood pressure is more likely to develop in: ? People who have blood pressure in the high end of the normal range (130-139/85-89 mm Hg). ? People   who are overweight or obese. ? People who are African American.  If you are 84-22 years of age, have your blood pressure checked every 3-5 years. If you are 67 years of age or older, have your blood pressure checked every year. You should have your blood pressure measured twice-once when you are at a hospital or clinic, and once when you are not at a hospital or clinic. Record the average of the two measurements. To check your blood pressure when you are not at a hospital or clinic, you can use: ? An automated blood pressure machine at a pharmacy. ? A home blood pressure monitor.  If you are between 52 years and 3 years old, ask your health care provider if you should take aspirin to prevent strokes.  Have regular diabetes screenings. This involves taking a blood sample to check your fasting blood sugar level. ? If you are at a normal weight and have a low risk for diabetes, have this test once  every three years after 45 years of age. ? If you are overweight and have a high risk for diabetes, consider being tested at a younger age or more often. Preventing infection Hepatitis B  If you have a higher risk for hepatitis B, you should be screened for this virus. You are considered at high risk for hepatitis B if: ? You were born in a country where hepatitis B is common. Ask your health care provider which countries are considered high risk. ? Your parents were born in a high-risk country, and you have not been immunized against hepatitis B (hepatitis B vaccine). ? You have HIV or AIDS. ? You use needles to inject street drugs. ? You live with someone who has hepatitis B. ? You have had sex with someone who has hepatitis B. ? You get hemodialysis treatment. ? You take certain medicines for conditions, including cancer, organ transplantation, and autoimmune conditions. Hepatitis C  Blood testing is recommended for: ? Everyone born from [birth year] through 1967. ? Anyone with known risk factors for hepatitis C. Sexually transmitted infections (STIs)  You should be screened for sexually transmitted infections (STIs) including gonorrhea and chlamydia if: ? You are sexually active and are younger than 45 years of age. ? You are older than 45 years of age and your health care provider tells you that you are at risk for this type of infection. ? Your sexual activity has changed since you were last screened and you are at an increased risk for chlamydia or gonorrhea. Ask your health care provider if you are at risk.  If you do not have HIV, but are at risk, it may be recommended that you take a prescription medicine daily to prevent HIV infection. This is called pre-exposure prophylaxis (PrEP). You are considered at risk if: ? You are sexually active and do not regularly use condoms or know the HIV status of your partner(s). ? You take drugs by injection. ? You are sexually active with a partner  who has HIV. Talk with your health care provider about whether you are at high risk of being infected with HIV. If you choose to begin PrEP, you should first be tested for HIV. You should then be tested every 3 months for as long as you are taking PrEP. Pregnancy  If you are premenopausal and you may become pregnant, ask your health care provider about preconception counseling.  If you may become pregnant, take 400 to 800 micrograms (mcg) of folic acid every  day.  If you want to prevent pregnancy, talk to your health care provider about birth control (contraception). Osteoporosis and menopause  Osteoporosis is a disease in which the bones lose minerals and strength with aging. This can result in serious bone fractures. Your risk for osteoporosis can be identified using a bone density scan.  If you are 37 years of age or older, or if you are at risk for osteoporosis and fractures, ask your health care provider if you should be screened.  Ask your health care provider whether you should take a calcium or vitamin D supplement to lower your risk for osteoporosis.  Menopause may have certain physical symptoms and risks.  Hormone replacement therapy may reduce some of these symptoms and risks. Talk to your health care provider about whether hormone replacement therapy is right for you. Follow these instructions at home:  Schedule regular health, dental, and eye exams.  Stay current with your immunizations.  Do not use any tobacco products including cigarettes, chewing tobacco, or electronic cigarettes.  If you are pregnant, do not drink alcohol.  If you are breastfeeding, limit how much and how often you drink alcohol.  Limit alcohol intake to no more than 1 drink per day for nonpregnant women. One drink equals 12 ounces of beer, 5 ounces of wine, or 1 ounces of hard liquor.  Do not use street drugs.  Do not share needles.  Ask your health care provider for help if you need support  or information about quitting drugs.  Tell your health care provider if you often feel depressed.  Tell your health care provider if you have ever been abused or do not feel safe at home. This information is not intended to replace advice given to you by your health care provider. Make sure you discuss any questions you have with your health care provider. Document Released: 12/21/2010 Document Revised: 11/13/2015 Document Reviewed: 03/11/2015 Elsevier Interactive Patient Education  [employer]. This information is directly available on the CDC website: RunningShows.co.za.html    Source:CDC Reference to specific commercial products, manufacturers, companies, or trademarks does not constitute its endorsement or recommendation by the [place], [place], or Centers for [employer] and Prevention.

## 2018-10-03 LAB — CBC WITH DIFFERENTIAL/PLATELET
Basophils Absolute: 0.1 10*3/uL (ref 0.0–0.2)
Basos: 2 %
EOS (ABSOLUTE): 0.1 10*3/uL (ref 0.0–0.4)
Eos: 3 %
Hematocrit: 36.7 % (ref 34.0–46.6)
Hemoglobin: 11.2 g/dL (ref 11.1–15.9)
Immature Grans (Abs): 0 10*3/uL (ref 0.0–0.1)
Immature Granulocytes: 0 %
Lymphocytes Absolute: 1.6 10*3/uL (ref 0.7–3.1)
Lymphs: 35 %
MCH: 23.3 pg — ABNORMAL LOW (ref 26.6–33.0)
MCHC: 30.5 g/dL — ABNORMAL LOW (ref 31.5–35.7)
MCV: 77 fL — ABNORMAL LOW (ref 79–97)
Monocytes Absolute: 0.7 10*3/uL (ref 0.1–0.9)
Monocytes: 14 %
Neutrophils Absolute: 2.2 10*3/uL (ref 1.4–7.0)
Neutrophils: 46 %
Platelets: 359 10*3/uL (ref 150–450)
RBC: 4.8 x10E6/uL (ref 3.77–5.28)
RDW: 15.6 % — ABNORMAL HIGH (ref 11.7–15.4)
WBC: 4.6 10*3/uL (ref 3.4–10.8)

## 2018-10-03 LAB — COMPREHENSIVE METABOLIC PANEL
ALT: 15 IU/L (ref 0–32)
AST: 14 IU/L (ref 0–40)
Albumin/Globulin Ratio: 1.2 (ref 1.2–2.2)
Albumin: 3.7 g/dL — ABNORMAL LOW (ref 3.8–4.8)
Alkaline Phosphatase: 65 IU/L (ref 39–117)
BUN/Creatinine Ratio: 15 (ref 9–23)
BUN: 13 mg/dL (ref 6–24)
Bilirubin Total: <0.2 mg/dL (ref 0.0–1.2)
CO2: 18 mmol/L — ABNORMAL LOW (ref 20–29)
Calcium: 9.2 mg/dL (ref 8.7–10.2)
Chloride: 104 mmol/L (ref 96–106)
Creatinine, Ser: 0.86 mg/dL (ref 0.57–1.00)
GFR calc Af Amer: 95 mL/min/{1.73_m2} (ref >59)
GFR calc non Af Amer: 82 mL/min/{1.73_m2} (ref >59)
Globulin, Total: 3.2 g/dL (ref 1.5–4.5)
Glucose: 89 mg/dL (ref 65–99)
Potassium: 4.6 mmol/L (ref 3.5–5.2)
Sodium: 135 mmol/L (ref 134–144)
Total Protein: 6.9 g/dL (ref 6.0–8.5)

## 2018-10-03 LAB — LIPID PANEL
Chol/HDL Ratio: 3.8 ratio (ref 0.0–4.4)
Cholesterol, Total: 204 mg/dL — ABNORMAL HIGH (ref 100–199)
HDL: 53 mg/dL (ref >39)
LDL Calculated: 126 mg/dL — ABNORMAL HIGH (ref 0–99)
Triglycerides: 125 mg/dL (ref 0–149)
VLDL Cholesterol Cal: 25 mg/dL (ref 5–40)

## 2018-10-03 LAB — HEMOGLOBIN A1C
Est. average glucose Bld gHb Est-mCnc: 117 mg/dL
Hgb A1c MFr Bld: 5.7 % — ABNORMAL HIGH (ref 4.8–5.6)

## 2018-10-03 LAB — TSH: TSH: 1.01 u[IU]/mL (ref 0.450–4.500)

## 2018-10-04 LAB — CYTOLOGY - PAP: Diagnosis: NEGATIVE

## 2018-10-06 ENCOUNTER — Other Ambulatory Visit: Payer: Self-pay

## 2018-10-06 DIAGNOSIS — Z1231 Encounter for screening mammogram for malignant neoplasm of breast: Secondary | ICD-10-CM

## 2019-09-14 ENCOUNTER — Ambulatory Visit: Payer: Self-pay | Attending: Internal Medicine

## 2019-09-14 DIAGNOSIS — Z23 Encounter for immunization: Secondary | ICD-10-CM

## 2019-09-14 NOTE — Progress Notes (Signed)
   Covid-19 Vaccination Clinic  Name:  Jaquese Kjar    MRN: [id number] DOB: 25-Dec-1975  09/14/2019  Ms. Chhay was observed post Covid-19 immunization for 15 minutes without incident. She was provided with Vaccine Information Sheet and instruction to access the V-Safe system.   Ms. Crotzer was instructed to call 911 with any severe reactions post vaccine: Marland Kitchen Difficulty breathing  . Swelling of face and throat  . A fast heartbeat  . A bad rash all over body  . Dizziness and weakness   Immunizations Administered    Name Date Dose VIS Date Route   Moderna COVID-19 Vaccine 09/14/2019  1:51 PM 0.5 mL 05/22/2019 Intramuscular   Manufacturer: Moderna   Lot: [id number]   [id number]

## 2019-10-03 ENCOUNTER — Encounter: Payer: 59 | Admitting: Internal Medicine

## 2019-10-12 ENCOUNTER — Ambulatory Visit: Payer: Self-pay | Attending: Internal Medicine

## 2019-10-12 DIAGNOSIS — Z23 Encounter for immunization: Secondary | ICD-10-CM

## 2019-10-12 NOTE — Progress Notes (Signed)
   Covid-19 Vaccination Clinic  Name:  Kathryn George    MRN: [id number] DOB: Oct 18, 1975  10/12/2019  [name] was observed post Covid-19 immunization for 15 minutes without incident. She was provided with Vaccine Information Sheet and instruction to access the V-Safe system.   [name] was instructed to call 911 with any severe reactions post vaccine: Marland Kitchen Difficulty breathing  . Swelling of face and throat  . A fast heartbeat  . A bad rash all over body  . Dizziness and weakness   Immunizations Administered    Name Date Dose VIS Date Route   Moderna COVID-19 Vaccine 10/12/2019  1:01 PM 0.5 mL 05/2019 Intramuscular   Manufacturer: Moderna   Lot: [id number]   [id number]

## 2019-10-15 ENCOUNTER — Encounter: Payer: Self-pay | Admitting: Internal Medicine

## 2019-10-15 ENCOUNTER — Other Ambulatory Visit: Payer: Self-pay

## 2019-10-15 ENCOUNTER — Ambulatory Visit (INDEPENDENT_AMBULATORY_CARE_PROVIDER_SITE_OTHER): Payer: BC Managed Care – PPO | Admitting: Internal Medicine

## 2019-10-15 VITALS — BP 124/68 | HR 73 | Temp 97.7°F | Ht 64.9 in | Wt 256.0 lb

## 2019-10-15 DIAGNOSIS — Z114 Encounter for screening for human immunodeficiency virus [HIV]: Secondary | ICD-10-CM

## 2019-10-15 DIAGNOSIS — Z1231 Encounter for screening mammogram for malignant neoplasm of breast: Secondary | ICD-10-CM | POA: Diagnosis not present

## 2019-10-15 DIAGNOSIS — E782 Mixed hyperlipidemia: Secondary | ICD-10-CM | POA: Diagnosis not present

## 2019-10-15 DIAGNOSIS — G43009 Migraine without aura, not intractable, without status migrainosus: Secondary | ICD-10-CM | POA: Insufficient documentation

## 2019-10-15 DIAGNOSIS — Z0000 Encounter for general adult medical examination without abnormal findings: Secondary | ICD-10-CM | POA: Diagnosis not present

## 2019-10-15 DIAGNOSIS — E559 Vitamin D deficiency, unspecified: Secondary | ICD-10-CM | POA: Insufficient documentation

## 2019-10-15 LAB — POCT URINALYSIS DIPSTICK
Bilirubin, UA: NEGATIVE
Glucose, UA: NEGATIVE
Ketones, UA: NEGATIVE
Leukocytes, UA: NEGATIVE
Nitrite, UA: NEGATIVE
Protein, UA: NEGATIVE
Spec Grav, UA: >=1.03 — AB (ref 1.010–1.025)
Urobilinogen, UA: 0.2 E.U./dL
pH, UA: 5 (ref 5.0–8.0)

## 2019-10-15 NOTE — Progress Notes (Signed)
Date:  10/15/2019   Name:  Kathryn George   DOB:  1975-09-26   MRN:  [id number]   Chief Complaint: Annual Exam (Breast Exam. Pap in 2 more years. ) Kathryn George is a 44 y.o. female who presents today for her Complete Annual Exam. She feels well. She reports exercising walking with work. She reports she is sleeping fairly well. She denies breast issues.  Menses are regular every 28 days but has midcycle clots.  No pain but some hot flashes.  Mammogram  08/2018 Pap  09/2018  Neg thin prep  Immunization History  Administered Date(s) Administered  . Moderna SARS-COVID-2 Vaccination 09/14/2019, 10/12/2019    Migraine  This is a recurrent problem. The problem occurs monthly. The problem has been unchanged. The pain quality is similar to prior headaches. Associated symptoms include dizziness. Pertinent negatives include no abdominal pain, coughing, fever, hearing loss, photophobia, tinnitus, visual change or vomiting. She has tried NSAIDs for the symptoms. The treatment provided significant relief.   Vitamin D def - levels low in 2018 - no supplementation and no recent labs.  She completed a course of high dose Rx last year.  Lab Results  Component Value Date   CREATININE 0.86 10/02/2018   BUN 13 10/02/2018   NA 135 10/02/2018   K 4.6 10/02/2018   CL 104 10/02/2018   CO2 18 (L) 10/02/2018   Lab Results  Component Value Date   CHOL 204 (H) 10/02/2018   HDL 53 10/02/2018   LDLCALC 126 (H) 10/02/2018   TRIG 125 10/02/2018   CHOLHDL 3.8 10/02/2018   Lab Results  Component Value Date   TSH 1.010 10/02/2018   Lab Results  Component Value Date   HGBA1C 5.7 (H) 10/02/2018   Lab Results  Component Value Date   WBC 4.6 10/02/2018   HGB 11.2 10/02/2018   HCT 36.7 10/02/2018   MCV 77 (L) 10/02/2018   PLT 359 10/02/2018   Lab Results  Component Value Date   ALT 15 10/02/2018   AST 14 10/02/2018   ALKPHOS 65 10/02/2018   BILITOT <0.2 10/02/2018   Last vitamin D Lab Results   Component Value Date   VD25OH 16.4 (L) 04/15/2017      Review of Systems  Constitutional: Negative for chills, fatigue and fever.  HENT: Negative for congestion, hearing loss, tinnitus, trouble swallowing and voice change.   Eyes: Negative for photophobia and visual disturbance.  Respiratory: Negative for cough, chest tightness, shortness of breath and wheezing.   Cardiovascular: Negative for chest pain, palpitations and leg swelling.  Gastrointestinal: Negative for abdominal pain, constipation, diarrhea and vomiting.  Endocrine: Negative for polydipsia and polyuria.  Genitourinary: Positive for menstrual problem (migraine). Negative for dysuria, frequency, genital sores, vaginal bleeding and vaginal discharge.  Musculoskeletal: Negative for arthralgias, gait problem and joint swelling.  Skin: Negative for color change and rash.  Allergic/Immunologic: Positive for environmental allergies.  Neurological: Positive for dizziness and headaches. Negative for tremors and light-headedness.  Hematological: Negative for adenopathy. Does not bruise/bleed easily.  Psychiatric/Behavioral: Negative for dysphoric mood and sleep disturbance. The patient is not nervous/anxious.     Patient Active Problem List   Diagnosis Date Noted  . Vitamin D deficiency 10/15/2019  . Sebaceous cyst of skin of left breast 08/25/2018  . Hyperlipidemia, mixed 04/17/2017  . Menorrhagia with regular cycle 10/04/2016  . Environmental and seasonal allergies 10/04/2016    No Known Allergies  History reviewed. No pertinent surgical history.  Social History  Tobacco Use  . Smoking status: Former Smoker    Types: Cigars    Quit date: 06/21/2017    Years since quitting: 2.3  . Smokeless tobacco: Never Used  . Tobacco comment: started cigars 2 years ago.  Substance Use Topics  . Alcohol use: Yes  . Drug use: No     Medication list has been reviewed and updated.  Current Meds  Medication Sig  . naproxen  (EC NAPROSYN) 500 MG EC tablet Take 1 tablet (500 mg total) by mouth 2 (two) times daily with a meal.    PHQ 2/9 Scores 10/15/2019 10/02/2018 04/15/2017  PHQ - 2 Score 0 0 0  PHQ- 9 Score 0 - -    BP Readings from Last 3 Encounters:  10/15/19 124/68  10/02/18 128/88  08/25/18 138/84    Physical Exam Vitals and nursing note reviewed.  Constitutional:      General: She is not in acute distress.    Appearance: She is well-developed.  HENT:     Head: Normocephalic and atraumatic.     Right Ear: Tympanic membrane and ear canal normal.     Left Ear: Tympanic membrane and ear canal normal.     Nose:     Right Sinus: No maxillary sinus tenderness.     Left Sinus: No maxillary sinus tenderness.  Eyes:     General: No scleral icterus.       Right eye: No discharge.        Left eye: No discharge.     Conjunctiva/sclera: Conjunctivae normal.  Neck:     Thyroid: No thyromegaly.     Vascular: No carotid bruit.  Cardiovascular:     Rate and Rhythm: Normal rate and regular rhythm.     Pulses: Normal pulses.     Heart sounds: Normal heart sounds.  Pulmonary:     Effort: Pulmonary effort is normal. No respiratory distress.     Breath sounds: No wheezing.  Chest:     Breasts:        Right: No mass, nipple discharge, skin change or tenderness.        Left: No mass, nipple discharge, skin change or tenderness.  Abdominal:     General: Bowel sounds are normal.     Palpations: Abdomen is soft.     Tenderness: There is no abdominal tenderness.  Musculoskeletal:        General: Normal range of motion.     Cervical back: Normal range of motion. No erythema.     Right lower leg: No edema.     Left lower leg: No edema.  Lymphadenopathy:     Cervical: No cervical adenopathy.  Skin:    General: Skin is warm and dry.     Capillary Refill: Capillary refill takes less than 2 seconds.     Findings: No rash.  Neurological:     General: No focal deficit present.     Mental Status: She is  alert and oriented to person, place, and time.     Cranial Nerves: No cranial nerve deficit.     Sensory: No sensory deficit.     Deep Tendon Reflexes: Reflexes are normal and symmetric.  Psychiatric:        Mood and Affect: Mood normal.        Speech: Speech normal.        Behavior: Behavior normal.        Thought Content: Thought content normal.     Wt Readings from  Last 3 Encounters:  10/15/19 256 lb (116.1 kg)  10/02/18 254 lb (115.2 kg)  08/25/18 250 lb (113.4 kg)    BP 124/68   Pulse 73   Temp 97.7 F (36.5 C) (Temporal)   Ht 5' 4.9" (1.648 m)   Wt 256 lb (116.1 kg)   SpO2 100%   BMI 42.73 kg/m   Assessment and Plan: 1. Annual physical exam Normal exam except for weight Work on diet changes and regular exercise - CBC with Differential/Platelet - Comprehensive metabolic panel - POCT urinalysis dipstick - TSH  2. Vitamin D deficiency Check levels and advise on supplementation - VITAMIN D 25 Hydroxy (Vit-D Deficiency, Fractures)  3. Encounter for screening mammogram for breast cancer Pt will scheduled at [place]; Future  4. Hyperlipidemia, mixed Check labs - Lipid panel  5. Encounter for screening for HIV - HIV Antibody (routine testing w rflx)  6. Migraine without aura and without status migrainosus, not intractable Responsive to Naproxen as needed No change in frequency or intensity Follow up if needed   Partially dictated using Dragon software. Any errors are unintentional.  Halina Maidens, MD [facility]  10/15/2019

## 2019-10-16 LAB — LIPID PANEL
Chol/HDL Ratio: 3.6 ratio (ref 0.0–4.4)
Cholesterol, Total: 199 mg/dL (ref 100–199)
HDL: 55 mg/dL (ref >39)
LDL Chol Calc (NIH): 123 mg/dL — ABNORMAL HIGH (ref 0–99)
Triglycerides: 119 mg/dL (ref 0–149)
VLDL Cholesterol Cal: 21 mg/dL (ref 5–40)

## 2019-10-16 LAB — COMPREHENSIVE METABOLIC PANEL
ALT: 12 IU/L (ref 0–32)
AST: 13 IU/L (ref 0–40)
Albumin/Globulin Ratio: 1.1 — ABNORMAL LOW (ref 1.2–2.2)
Albumin: 3.8 g/dL (ref 3.8–4.8)
Alkaline Phosphatase: 69 IU/L (ref 39–117)
BUN/Creatinine Ratio: 18 (ref 9–23)
BUN: 13 mg/dL (ref 6–24)
Bilirubin Total: 0.2 mg/dL (ref 0.0–1.2)
CO2: 20 mmol/L (ref 20–29)
Calcium: 9 mg/dL (ref 8.7–10.2)
Chloride: 102 mmol/L (ref 96–106)
Creatinine, Ser: 0.74 mg/dL (ref 0.57–1.00)
GFR calc Af Amer: 113 mL/min/{1.73_m2} (ref >59)
GFR calc non Af Amer: 98 mL/min/{1.73_m2} (ref >59)
Globulin, Total: 3.4 g/dL (ref 1.5–4.5)
Glucose: 79 mg/dL (ref 65–99)
Potassium: 4.4 mmol/L (ref 3.5–5.2)
Sodium: 136 mmol/L (ref 134–144)
Total Protein: 7.2 g/dL (ref 6.0–8.5)

## 2019-10-16 LAB — CBC WITH DIFFERENTIAL/PLATELET
Basophils Absolute: 0.1 10*3/uL (ref 0.0–0.2)
Basos: 1 %
EOS (ABSOLUTE): 0.1 10*3/uL (ref 0.0–0.4)
Eos: 3 %
Hematocrit: 37 % (ref 34.0–46.6)
Hemoglobin: 11.4 g/dL (ref 11.1–15.9)
Immature Grans (Abs): 0 10*3/uL (ref 0.0–0.1)
Immature Granulocytes: 0 %
Lymphocytes Absolute: 1.5 10*3/uL (ref 0.7–3.1)
Lymphs: 35 %
MCH: 22.9 pg — ABNORMAL LOW (ref 26.6–33.0)
MCHC: 30.8 g/dL — ABNORMAL LOW (ref 31.5–35.7)
MCV: 74 fL — ABNORMAL LOW (ref 79–97)
Monocytes Absolute: 0.6 10*3/uL (ref 0.1–0.9)
Monocytes: 14 %
Neutrophils Absolute: 2 10*3/uL (ref 1.4–7.0)
Neutrophils: 47 %
Platelets: 374 10*3/uL (ref 150–450)
RBC: 4.97 x10E6/uL (ref 3.77–5.28)
RDW: 15.2 % (ref 11.7–15.4)
WBC: 4.2 10*3/uL (ref 3.4–10.8)

## 2019-10-16 LAB — HIV ANTIBODY (ROUTINE TESTING W REFLEX): HIV Screen 4th Generation wRfx: NONREACTIVE

## 2019-10-16 LAB — VITAMIN D 25 HYDROXY (VIT D DEFICIENCY, FRACTURES): Vit D, 25-Hydroxy: 13.4 ng/mL — ABNORMAL LOW (ref 30.0–100.0)

## 2019-10-16 LAB — TSH: TSH: 1.11 u[IU]/mL (ref 0.450–4.500)

## 2020-07-07 ENCOUNTER — Ambulatory Visit: Payer: BC Managed Care – PPO | Admitting: Podiatry

## 2020-07-21 ENCOUNTER — Ambulatory Visit: Payer: BC Managed Care – PPO | Admitting: Podiatry

## 2020-07-24 ENCOUNTER — Ambulatory Visit
Admission: RE | Admit: 2020-07-24 | Discharge: 2020-07-24 | Disposition: A | Discharge status: Home / Self Care | Payer: BC Managed Care – PPO | Source: Ambulatory Visit | Attending: Family Medicine | Admitting: Family Medicine

## 2020-07-24 ENCOUNTER — Other Ambulatory Visit: Payer: Self-pay | Admitting: Internal Medicine

## 2020-07-24 ENCOUNTER — Ambulatory Visit: Payer: Self-pay | Admitting: Internal Medicine

## 2020-07-24 ENCOUNTER — Telehealth: Payer: Self-pay | Admitting: Family Medicine

## 2020-07-24 ENCOUNTER — Other Ambulatory Visit: Payer: Self-pay

## 2020-07-24 ENCOUNTER — Ambulatory Visit
Admission: EM | Admit: 2020-07-24 | Discharge: 2020-07-24 | Disposition: A | Discharge status: Home / Self Care | Payer: BC Managed Care – PPO | Attending: Family Medicine | Admitting: Family Medicine

## 2020-07-24 DIAGNOSIS — N939 Abnormal uterine and vaginal bleeding, unspecified: Secondary | ICD-10-CM | POA: Diagnosis not present

## 2020-07-24 DIAGNOSIS — N920 Excessive and frequent menstruation with regular cycle: Secondary | ICD-10-CM

## 2020-07-24 DIAGNOSIS — D259 Leiomyoma of uterus, unspecified: Secondary | ICD-10-CM | POA: Insufficient documentation

## 2020-07-24 LAB — CBC WITH DIFFERENTIAL/PLATELET
Abs Immature Granulocytes: 0.02 10*3/uL (ref 0.00–0.07)
Basophils Absolute: 0.1 10*3/uL (ref 0.0–0.1)
Basophils Relative: 1 %
Eosinophils Absolute: 0.1 10*3/uL (ref 0.0–0.5)
Eosinophils Relative: 1 %
HCT: 35.4 % — ABNORMAL LOW (ref 36.0–46.0)
Hemoglobin: 11.1 g/dL — ABNORMAL LOW (ref 12.0–15.0)
Immature Granulocytes: 0 %
Lymphocytes Relative: 27 %
Lymphs Abs: 1.6 10*3/uL (ref 0.7–4.0)
MCH: 22.3 pg — ABNORMAL LOW (ref 26.0–34.0)
MCHC: 31.4 g/dL (ref 30.0–36.0)
MCV: 71.1 fL — ABNORMAL LOW (ref 80.0–100.0)
Monocytes Absolute: 0.8 10*3/uL (ref 0.1–1.0)
Monocytes Relative: 14 %
Neutro Abs: 3.4 10*3/uL (ref 1.7–7.7)
Neutrophils Relative %: 57 %
Platelets: 384 10*3/uL (ref 150–400)
RBC: 4.98 MIL/uL (ref 3.87–5.11)
RDW: 16.9 % — ABNORMAL HIGH (ref 11.5–15.5)
WBC: 5.9 10*3/uL (ref 4.0–10.5)
nRBC: 0 % (ref 0.0–0.2)

## 2020-07-24 MED ORDER — MEDROXYPROGESTERONE ACETATE 10 MG PO TABS: 10.0000 mg | ORAL_TABLET | Freq: Every day | ORAL | 0 refills | Status: DC

## 2020-07-24 NOTE — Telephone Encounter (Signed)
Pt reports vaginal bleeding. States "My cycle has been going on for 3-4 months."  States ended cycle Sunday, no bleeding Mon or Tues, " Bleeding and felt weird Wednesday."  Today "I stood up at work and it's like my bottom fell out. I had to change my clothes."  Reports clots "But I always have clots."  Reports dizziness "But I'm always dizzy." States pad saturated in 30 minutes "And I have to change my pants again." Advised UC. Pt states "That's where I planned to go anyways." Assured pt NT would route to practice for PCPs review. Care advise given.  Reason for Disposition . MODERATE vaginal bleeding (e.g., soaking 1 pad or tampon per hour and present > 6 hours; 1 menstrual cup every 6 hours)  Answer Assessment - Initial Assessment Questions 1. AMOUNT: "Describe the bleeding that you are having."    - SPOTTING: spotting, or pinkish / brownish mucous discharge; does not fill panti-liner or pad    - MILD:  less than 1 pad / hour; less than patient's usual menstrual bleeding   - MODERATE: 1-2 pads / hour; 1 menstrual cup every 6 hours; small-medium blood clots (e.g., pea, grape, small coin)   - SEVERE: soaking 2 or more pads/hour for 2 or more hours; 1 menstrual cup every 2 hours; bleeding not contained by pads or continuous red blood from vagina; large blood clots (e.g., golf ball, large coin)      Moderate-severe 2. ONSetT: "When did the bleeding begin?" "Is it continuing now?"      3. MENSTRUAL PERIOD: "When was the last normal menstrual period?" "How is this different than your period?"     4. REGULARITY: "How regular are your periods?"    Irregular, ongoing 5. ABDOMINAL PAIN: "Do you have any pain?" "How bad is the pain?"  (e.g., Scale 1-10; mild, moderate, or severe)   - MILD (1-3): doesn't interfere with normal activities, abdomen soft and not tender to touch    - MODERATE (4-7): interferes with normal activities or awakens from sleep, tender to touch    - SEVERE (8-10): excruciating  pain, doubled over, unable to do any normal activities      Mild cramping 6. PREGNANCY: "Could you be pregnant?" "Are you sexually active?" "Did you recently give birth?"     no 7. BREASTFEEDING: "Are you breastfeeding?"     *No Answer* 8. HORMONES: "Are you taking any hormone medications, prescription or OTC?" (e.g., birth control pills, estrogen)     *No Answer* 9. BLOOD THINNERS: "Do you take any blood thinners?" (e.g., Coumadin/warfarin, Pradaxa/dabigatran, aspirin)     no 10. CAUSE: "What do you think is causing the bleeding?" (e.g., recent gyn surgery, recent gyn procedure; known bleeding disorder, cervical cancer, polycystic ovarian disease, fibroids)         Unsure 11. HEMODYNAMIC STATUS: "Are you weak or feeling lightheaded?" If Yes, ask: "Can you stand and walk normally?"        Dizzy 12. OTHER SYMPTOMS: "What other symptoms are you having with the bleeding?" (e.g., passed tissue, vaginal discharge, fever, menstrual-type cramps)  Protocols used: VAGINAL BLEEDING - ABNORMAL-A-AH

## 2020-07-24 NOTE — ED Provider Notes (Signed)
[facility]    CSN: [id number] Arrival date & time: 07/24/20  1636      History   Chief Complaint Chief Complaint  Patient presents with  . Vaginal Bleeding   HPI   45 year old female presents with the above complaints.  Patient reports that she has been experiencing vaginal bleeding for the past 4 months.  She states that she has had approximately 1 week where she did not have bleeding.  She states that she is experiencing heavy bleeding at times with the passage of clots.  Other times it seems to be more normal.  She states that this is getting quite frustrating as she does not know when she is going to experience the bleeding.  No significant pain.  No relieving factors.  Patient is concerned that she may be having early menopause.  No other reported symptoms.  No other complaints.  Past Medical History:  Diagnosis Date  . Allergy    year round allergies- eyes water. nose runs.   . Anxiety 2014  . Insomnia 2014    Patient Active Problem List   Diagnosis Date Noted  . Vitamin D deficiency 10/15/2019  . Migraine without aura and without status migrainosus, not intractable 10/15/2019  . Sebaceous cyst of skin of left breast 08/25/2018  . Hyperlipidemia, mixed 04/17/2017  . Menorrhagia with regular cycle 10/04/2016  . Environmental and seasonal allergies 10/04/2016    History reviewed. No pertinent surgical history.  OB History   No obstetric history on file.      Home Medications    Prior to Admission medications   Medication Sig Start Date End Date Taking? Authorizing Provider  medroxyPROGESTERone (PROVERA) 10 MG tablet Take 1 tablet (10 mg total) by mouth daily. 07/24/20   Coral Spikes, DO  naproxen (EC NAPROSYN) 500 MG EC tablet Take 1 tablet (500 mg total) by mouth 2 (two) times daily with a meal. 10/02/18   Glean Hess, MD    Family History Family History  Problem Relation Age of Onset  . Breast cancer Mother 46  . Diabetes Mother   .  Diabetes Maternal Aunt   . Cancer Paternal Aunt   . Diabetes Maternal Grandmother   . Dementia Maternal Grandmother   . Heart disease Maternal Grandfather 60  . Breast cancer Paternal Grandmother     Social History Social History   Tobacco Use  . Smoking status: Former Smoker    Types: Cigars    Quit date: 06/21/2017    Years since quitting: 3.0  . Smokeless tobacco: Never Used  . Tobacco comment: started cigars 2 years ago.  Vaping Use  . Vaping Use: Never used  Substance Use Topics  . Alcohol use: Yes    Comment: social  . Drug use: No     Allergies   Patient has no known allergies.   Review of Systems Review of Systems  Constitutional: Negative.   Genitourinary: Positive for menstrual problem.   Physical Exam Triage Vital Signs ED Triage Vitals  Enc Vitals Group     BP 07/24/20 1655 (!) 156/96     Pulse Rate 07/24/20 1655 86     Resp 07/24/20 1655 20     Temp 07/24/20 1655 97.9 F (36.6 C)     Temp Source 07/24/20 1655 Oral     SpO2 07/24/20 1655 100 %     Weight 07/24/20 1652 240 lb (108.9 kg)     Height 07/24/20 1652 5\' 3"  (1.6 m)  Head Circumference --      Peak Flow --      Pain Score 07/24/20 1652 1     Pain Loc --      Pain Edu? --      Excl. in [place]? --    Updated Vital Signs BP (!) 156/96 (BP Location: Left Arm)   Pulse 86   Temp 97.9 F (36.6 C) (Oral)   Resp 20   Ht 5\' 3"  (1.6 m)   Wt 108.9 kg   LMP 07/14/2020   SpO2 100%   BMI 42.51 kg/m   Visual Acuity Right Eye Distance:   Left Eye Distance:   Bilateral Distance:    Right Eye Near:   Left Eye Near:    Bilateral Near:     Physical Exam Vitals and nursing note reviewed.  Constitutional:      General: She is not in acute distress.    Appearance: Normal appearance. She is not ill-appearing.  HENT:     Head: Normocephalic and atraumatic.  Cardiovascular:     Rate and Rhythm: Normal rate and regular rhythm.  Pulmonary:     Effort: Pulmonary effort is normal.     Breath  sounds: Normal breath sounds. No wheezing, rhonchi or rales.  Neurological:     Mental Status: She is alert.  Psychiatric:        Mood and Affect: Mood normal.        Behavior: Behavior normal.    UC Treatments / Results  Labs (all labs ordered are listed, but only abnormal results are displayed) Labs Reviewed  CBC WITH DIFFERENTIAL/PLATELET - Abnormal; Notable for the following components:      Result Value   Hemoglobin 11.1 (*)    HCT 35.4 (*)    MCV 71.1 (*)    MCH 22.3 (*)    RDW 16.9 (*)    All other components within normal limits    EKG   Radiology US PELVIC COMPLETE WITH TRANSVAGINAL  Result Date: 07/24/2020 CLINICAL DATA:  45 year old female with heavy uterine bleeding for 4 months. LMP 07/14/2020. EXAM: TRANSABDOMINAL AND TRANSVAGINAL ULTRASOUND OF PELVIS TECHNIQUE: Both transabdominal and transvaginal ultrasound examinations of the pelvis were performed. Transabdominal technique was performed for global imaging of the pelvis including uterus, ovaries, adnexal regions, and pelvic cul-de-sac. It was necessary to proceed with endovaginal exam following the transabdominal exam to visualize the endometrium and adnexa. COMPARISON:  None FINDINGS: Uterus Measurements: 14.5 x 9.4 x 9.5 cm = volume: 673 mL. Bulky myomatous uterus, poorly visualized due to patient habitus on the transabdominal sequences and due to slightly retroverted uterine position on the transvaginal sequences, with representative fibroids as follows (transabdominal measurements): -anterior lower uterine segment intramural 3.2 x 2.0 x 1.5 cm fibroid -anterior right uterine body intramural 3.9 x 2.7 x 2.2 cm fibroid -left anterior uterine body intramural 3.6 x 2.6 x 2.3 cm fibroid -fundal intramural 3.5 x 2.9 x 3.5 cm fibroid Endometrium Nondiagnostic evaluation of the endometrium due to poor visualization. Right ovary Nonvisualization of the right ovary.  No right adnexal masses. Left ovary Measurements: 4.1 x 3.2 x  3.5 cm (transabdominal measurements) = volume: 24 mL. Normal appearance/no adnexal mass. Other findings No abnormal free fluid. IMPRESSION: 1. Limited scan. Bulky myomatous uterus with representative fibroids as detailed. MRI pelvis without and with IV contrast may be considered for further evaluation given the limitations of this ultrasound study. 2. Nondiagnostic evaluation of the endometrium due to poor visualization. 3. Nonvisualization of the  right ovary. Normal left ovary. No abnormal adnexal masses. Electronically Signed   By: Ilona Sorrel M.D.   On: 07/24/2020 18:53    Procedures Procedures (including critical care time)  Medications Ordered in UC Medications - No data to display  Initial Impression / Assessment and Plan / UC Course  I have reviewed the triage vital signs and the nursing notes.  Pertinent labs & imaging results that were available during my care of the patient were reviewed by me and considered in my medical decision making (see chart for details).    45 year old female presents with vaginal bleeding.  Hemoglobin stable.  Ultrasound was obtained and revealed myomatous uterus with multiple fibroids.  Placing on Provera.  I have sent a message to her physician.  She will need to see GYN.  Likely needs hysterectomy.  Final Clinical Impressions(s) / UC Diagnoses   Final diagnoses:  Vaginal bleeding     Discharge Instructions     Go to the medical mall at [facility].   I'll call with the result.  Take care  Dr. Lacinda Axon    ED Prescriptions    None     PDMP not reviewed this encounter.   Coral Spikes, [place] 07/24/20 1929

## 2020-07-24 NOTE — ED Triage Notes (Signed)
Pt states for past 4 months her menses have been different. Heavier bleeding, clots at times, and bleeds at odd times in between menses. Talked to Dr. Carolin Coy and was told its part of menopause. Sometimes feels like she is peeing on herself as well. States not particularly painful.

## 2020-07-24 NOTE — Discharge Instructions (Signed)
Go to the medical mall at [facility].   I'll call with the result.  Take care  Dr. Lacinda Axon

## 2020-07-24 NOTE — Telephone Encounter (Signed)
Noted  Pt advised to go to UC.  KP 

## 2020-07-24 NOTE — Telephone Encounter (Signed)
Patient informed of [place] results. Placing on Provera.  Needs to see GYN.   [facility]

## 2020-07-28 ENCOUNTER — Telehealth: Payer: Self-pay

## 2020-07-28 NOTE — Telephone Encounter (Signed)
[facility] referring for Irregular uterine bleeding. [place] Called and left voicemail for patient to call back to be scheduled.

## 2020-07-30 ENCOUNTER — Ambulatory Visit (INDEPENDENT_AMBULATORY_CARE_PROVIDER_SITE_OTHER): Payer: BC Managed Care – PPO | Admitting: Podiatry

## 2020-07-30 ENCOUNTER — Ambulatory Visit (INDEPENDENT_AMBULATORY_CARE_PROVIDER_SITE_OTHER): Payer: BC Managed Care – PPO

## 2020-07-30 ENCOUNTER — Encounter: Payer: Self-pay | Admitting: Podiatry

## 2020-07-30 ENCOUNTER — Other Ambulatory Visit: Payer: Self-pay

## 2020-07-30 DIAGNOSIS — M722 Plantar fascial fibromatosis: Secondary | ICD-10-CM | POA: Diagnosis not present

## 2020-07-30 DIAGNOSIS — M778 Other enthesopathies, not elsewhere classified: Secondary | ICD-10-CM

## 2020-07-30 MED ORDER — MELOXICAM 15 MG PO TABS: 15.0000 mg | ORAL_TABLET | Freq: Every day | ORAL | 3 refills | Status: DC

## 2020-07-30 MED ORDER — TRIAMCINOLONE ACETONIDE 40 MG/ML IJ SUSP
20.0000 mg | Freq: Once | INTRAMUSCULAR | Status: AC
  Administered 2020-07-30: 20 mg

## 2020-07-30 MED ORDER — METHYLPREDNISOLONE 4 MG PO TBPK: ORAL_TABLET | ORAL | 0 refills | Status: DC

## 2020-07-30 NOTE — Progress Notes (Signed)
  Subjective:  Patient ID: Kathryn George, female    DOB: Oct 13, 1975,  MRN: [id number] HPI Chief Complaint  Patient presents with  . Foot Pain    Lateral side left - aching, sometimes sharp x 2 months, noticed a lot of swelling in lower leg initially, takes aleve PRN and elevates periodically  . New Patient (Initial Visit)    45 y.o. female presents with the above complaint.   ROS: Denies fever chills nausea vomiting muscle aches pains calf pain back pain chest pain shortness of breath.  Past Medical History:  Diagnosis Date  . Allergy    year round allergies- eyes water. nose runs.   . Anxiety 2014  . Insomnia 2014   No past surgical history on file.  Current Outpatient Medications:  .  meloxicam (MOBIC) 15 MG tablet, Take 1 tablet (15 mg total) by mouth daily., Disp: 30 tablet, Rfl: 3 .  methylPREDNISolone (MEDROL DOSEPAK) 4 MG TBPK tablet, 6 day dose pack - take as directed, Disp: 21 tablet, Rfl: 0 .  medroxyPROGESTERone (PROVERA) 10 MG tablet, Take 1 tablet (10 mg total) by mouth daily., Disp: 10 tablet, Rfl: 0 .  naproxen (EC NAPROSYN) 500 MG EC tablet, Take 1 tablet (500 mg total) by mouth 2 (two) times daily with a meal., Disp: 60 tablet, Rfl: 5  No Known Allergies Review of Systems Objective:  There were no vitals filed for this visit.  General: Well developed, nourished, in no acute distress, alert and oriented x3   Dermatological: Skin is warm, dry and supple bilateral. Nails x 10 are well maintained; remaining integument appears unremarkable at this time. There are no open sores, no preulcerative lesions, no rash or signs of infection present.  Vascular: Dorsalis Pedis artery and Posterior Tibial artery pedal pulses are 2/4 bilateral with immedate capillary fill time. Pedal hair growth present. No varicosities and no lower extremity edema present bilateral.   Neruologic: Grossly intact via light touch bilateral. Vibratory intact via tuning fork bilateral. Protective  threshold with Semmes Wienstein monofilament intact to all pedal sites bilateral. Patellar and Achilles deep tendon reflexes 2+ bilateral. No Babinski or clonus noted bilateral.   Musculoskeletal: No gross boney pedal deformities bilateral. No pain, crepitus, or limitation noted with foot and ankle range of motion bilateral. Muscular strength 5/5 in all groups tested bilateral.  Pain on palpation medial continue tubercle of the left heel with pain on palpation of the peroneal tendons as well as the fifth metatarsal base.  Gait: Unassisted, Nonantalgic.    Radiographs:  Radiographs taken today demonstrate an osseously mature individual left foot soft tissue increase in density plantar pressure calcaneal insertion site.  No acute findings are noted.  No stress fractures.  Assessment & Plan:   Assessment: At this point I feel that this is plantar fasciitis with lateral compensatory syndrome.  Plan: Start her on methylprednisolone to be followed by meloxicam.  Injected her left heel 20 mg Kenalog 5 mg Marcaine tolerated procedure well after sterile Betadine skin prep.  Placed her in a plantar fascial brace to be followed by a night splint.  Discussed appropriate shoe gear stretching exercises ice therapy sugar modifications and the possible need for orthotics.  Follow-up with her in 1 month     Marji Kuehnel T. [place], [place]

## 2020-07-31 ENCOUNTER — Ambulatory Visit (INDEPENDENT_AMBULATORY_CARE_PROVIDER_SITE_OTHER): Payer: BC Managed Care – PPO | Admitting: Obstetrics and Gynecology

## 2020-07-31 ENCOUNTER — Other Ambulatory Visit (HOSPITAL_COMMUNITY)
Admission: RE | Admit: 2020-07-31 | Discharge: 2020-07-31 | Disposition: A | Discharge status: Home / Self Care | Payer: BC Managed Care – PPO | Source: Ambulatory Visit | Attending: Obstetrics and Gynecology | Admitting: Obstetrics and Gynecology

## 2020-07-31 ENCOUNTER — Encounter: Payer: Self-pay | Admitting: Obstetrics and Gynecology

## 2020-07-31 VITALS — BP 127/83 | Ht 63.0 in | Wt 250.0 lb

## 2020-07-31 DIAGNOSIS — D252 Subserosal leiomyoma of uterus: Secondary | ICD-10-CM

## 2020-07-31 DIAGNOSIS — N951 Menopausal and female climacteric states: Secondary | ICD-10-CM | POA: Diagnosis not present

## 2020-07-31 DIAGNOSIS — D251 Intramural leiomyoma of uterus: Secondary | ICD-10-CM | POA: Diagnosis not present

## 2020-07-31 DIAGNOSIS — R935 Abnormal findings on diagnostic imaging of other abdominal regions, including retroperitoneum: Secondary | ICD-10-CM

## 2020-07-31 DIAGNOSIS — N939 Abnormal uterine and vaginal bleeding, unspecified: Secondary | ICD-10-CM

## 2020-07-31 DIAGNOSIS — R5383 Other fatigue: Secondary | ICD-10-CM

## 2020-07-31 DIAGNOSIS — D250 Submucous leiomyoma of uterus: Secondary | ICD-10-CM | POA: Insufficient documentation

## 2020-07-31 DIAGNOSIS — D509 Iron deficiency anemia, unspecified: Secondary | ICD-10-CM

## 2020-07-31 DIAGNOSIS — Z6841 Body Mass Index (BMI) 40.0 and over, adult: Secondary | ICD-10-CM

## 2020-07-31 NOTE — Progress Notes (Signed)
Gynecology Abnormal Uterine Bleeding Initial Evaluation   Chief Complaint:  Chief Complaint  Patient presents with  . Menorrhagia    Referral for menorrhagia. RM 3    History of Present Illness:    Paitient is a 45 y.o. No obstetric history on file. who LMP was Patient's last menstrual period was 07/14/2020., presents today for a problem visit.  She complains of menometrorrhagia that  began several months ago and its severity is described as moderate.  The patient menstrual complaints are chronic present for the past 6 months. Some associated dysmenorrhea.  She was started on provera previously..  Last Pap results esults were obtained 10/02/2018 no abnormalities (cytology only)   Previous evaluation: 07/24/2020 TVUS Prior Diagnosis: uterine fibroids.  CBC at that time H&H 11.1g/dL and 35.4% with MCV 71.1 fL Previous Treatment: Started on provera minimal decrease in bleeding  Review of Systems: Review of Systems  Constitutional: Negative.   Gastrointestinal: Negative.   Genitourinary: Negative.   Endo/Heme/Allergies: Does not bruise/bleed easily.    Past Medical History:  Patient Active Problem List   Diagnosis Date Noted  . Intramural and subserous leiomyoma of uterus 07/31/2020  . Abnormal uterine bleeding 07/31/2020  . Microcytic anemia 07/31/2020  . Vitamin D deficiency 10/15/2019    Vit D = 12  09/2019 - > recommended 2000 IU daily   . Migraine without aura and without status migrainosus, not intractable 10/15/2019  . Sebaceous cyst of skin of left breast 08/25/2018  . Hyperlipidemia, mixed 04/17/2017    The 10-year ASCVD risk score Mikey Bussing DC Brooke Bonito., et al., 2013) is: 0.7%   Values used to calculate the score:     Age: 50 years     Sex: Female     Is Non-Hispanic African American: Yes     Diabetic: No     Tobacco smoker: Yes     Systolic Blood Pressure: 947 mmHg     Is BP treated: No     HDL Cholesterol: 54 mg/dL     Total Cholesterol: 233 mg/dL    . Menorrhagia with  regular cycle 10/04/2016  . Environmental and seasonal allergies 10/04/2016    Past Surgical History:  History reviewed. No pertinent surgical history.  Obstetric History: No obstetric history on file.  Family History:  Family History  Problem Relation Age of Onset  . Breast cancer Mother 2  . Diabetes Mother   . Diabetes Maternal Aunt   . Cancer Paternal Aunt   . Diabetes Maternal Grandmother   . Dementia Maternal Grandmother   . Heart disease Maternal Grandfather 21  . Breast cancer Paternal Grandmother     Social History:  Social History   Socioeconomic History  . Marital status: Single    Spouse name: Not on file  . Number of children: Not on file  . Years of education: Not on file  . Highest education level: Not on file  Occupational History  . Not on file  Tobacco Use  . Smoking status: Former Smoker    Types: Cigars    Quit date: 06/21/2017    Years since quitting: 3.1  . Smokeless tobacco: Never Used  . Tobacco comment: started cigars 2 years ago.  Vaping Use  . Vaping Use: Never used  Substance and Sexual Activity  . Alcohol use: Yes    Comment: social  . Drug use: No  . Sexual activity: Yes    Birth control/protection: None  Other Topics Concern  . Not on file  Social History Narrative  . Not on file   Social Determinants of Health   Financial Resource Strain: Not on file  Food Insecurity: Not on file  Transportation Needs: Not on file  Physical Activity: Not on file  Stress: Not on file  Social Connections: Not on file  Intimate Partner Violence: Not on file    Allergies:  No Known Allergies  Medications: Prior to Admission medications   Medication Sig Start Date End Date Taking? Authorizing Provider  medroxyPROGESTERone (PROVERA) 10 MG tablet Take 1 tablet (10 mg total) by mouth daily. 07/24/20   Coral Spikes, DO  meloxicam (MOBIC) 15 MG tablet Take 1 tablet (15 mg total) by mouth daily. 07/30/20   Hyatt, Max T, DPM  methylPREDNISolone  (MEDROL DOSEPAK) 4 MG TBPK tablet 6 day dose pack - take as directed 07/30/20   Hyatt, Max T, DPM  naproxen (EC NAPROSYN) 500 MG EC tablet Take 1 tablet (500 mg total) by mouth 2 (two) times daily with a meal. 10/02/18   Glean Hess, MD    Physical Exam Blood pressure 127/83, height 5\' 3"  (1.6 m), weight 250 lb (113.4 kg), last menstrual period 07/14/2020. Body mass index is 44.29 kg/m.   Patient's last menstrual period was 07/14/2020.  General: NAD HEENT: normocephalic, anicteric Thyroid: no enlargement, no palpable nodules Pulmonary: No increased work of breathing Cardiovascular: RRR, distal pulses 2+ Abdomen: NABS, soft, non-tender, non-distended.  Umbilicus without lesions.  No hepatomegaly, splenomegaly or masses palpable. No evidence of hernia  Genitourinary:  External: Normal external female genitalia.  Normal urethral meatus, normal Bartholin's and Skene's glands.    Vagina: Normal vaginal mucosa, no evidence of prolapse.    Cervix: Grossly normal in appearance, light dark red bleeding present  Uterus: Enlarged 14-15 week size uterus, mobile, irregular contour.  No CMT  Adnexa: ovaries non-enlarged, no adnexal masses  Rectal: deferred  Lymphatic: no evidence of inguinal lymphadenopathy Extremities: no edema, erythema, or tenderness Neurologic: Grossly intact Psychiatric: mood appropriate, affect full  Female chaperone present for pelvic portions of the physical exam   ENDOMETRIAL BIOPSY     The indications for endometrial biopsy were reviewed.   Risks of the biopsy including cramping, bleeding, infection, uterine perforation, inadequate specimen and need for additional procedures  were discussed. The patient states she understands and agrees to undergo procedure today. Consent was signed. Time out was performed. Urine HCG was negative. A Graves speculum was placed and the cervix was brought into view.  The cervix was prepped with Betadine. A single-toothed tenaculum was  not placed on the anterior lip of the cervix for traction. A 3 mm pipelle was introduced through the cervix into the endometrial cavity without difficulty to a depth of ~12 cm, and a moderate amount of tissue was obtained, the resulting specime sent to pathology. The instruments were removed from the patient's vagina. Minimal bleeding from the cervix was noted. The patient tolerated the procedure well. Routine post-procedure instructions were given to the patient.  She will be contacted by phone one results become available.       US PELVIC COMPLETE WITH TRANSVAGINAL  Result Date: 07/24/2020 CLINICAL DATA:  45 year old female with heavy uterine bleeding for 4 months. LMP 07/14/2020. EXAM: TRANSABDOMINAL AND TRANSVAGINAL ULTRASOUND OF PELVIS TECHNIQUE: Both transabdominal and transvaginal ultrasound examinations of the pelvis were performed. Transabdominal technique was performed for global imaging of the pelvis including uterus, ovaries, adnexal regions, and pelvic cul-de-sac. It was necessary to proceed with endovaginal exam following  the transabdominal exam to visualize the endometrium and adnexa. COMPARISON:  None FINDINGS: Uterus Measurements: 14.5 x 9.4 x 9.5 cm = volume: 673 mL. Bulky myomatous uterus, poorly visualized due to patient habitus on the transabdominal sequences and due to slightly retroverted uterine position on the transvaginal sequences, with representative fibroids as follows (transabdominal measurements): -anterior lower uterine segment intramural 3.2 x 2.0 x 1.5 cm fibroid -anterior right uterine body intramural 3.9 x 2.7 x 2.2 cm fibroid -left anterior uterine body intramural 3.6 x 2.6 x 2.3 cm fibroid -fundal intramural 3.5 x 2.9 x 3.5 cm fibroid Endometrium Nondiagnostic evaluation of the endometrium due to poor visualization. Right ovary Nonvisualization of the right ovary.  No right adnexal masses. Left ovary Measurements: 4.1 x 3.2 x 3.5 cm (transabdominal measurements) = volume:  24 mL. Normal appearance/no adnexal mass. Other findings No abnormal free fluid. IMPRESSION: 1. Limited scan. Bulky myomatous uterus with representative fibroids as detailed. MRI pelvis without and with IV contrast may be considered for further evaluation given the limitations of this ultrasound study. 2. Nondiagnostic evaluation of the endometrium due to poor visualization. 3. Nonvisualization of the right ovary. Normal left ovary. No abnormal adnexal masses. Electronically Signed   By: Ilona Sorrel M.D.   On: 07/24/2020 18:53     Assessment: 45 y.o. No obstetric history on file. with abnormal uterine bleeding  Plan: Problem List Items Addressed This Visit      Genitourinary   Intramural and subserous leiomyoma of uterus   Relevant Orders   Surgical pathology   Abnormal uterine bleeding   Relevant Orders   TSH   FSH/LH   Estradiol   CBC   Prolactin   Surgical pathology     Other   Microcytic anemia - Primary   Relevant Orders   CBC    Other Visit Diagnoses    Perimenopausal vasomotor symptoms       Relevant Orders   TSH   FSH/LH   Estradiol   Prolactin   Other fatigue       Relevant Orders   TSH   Abnormal endometrial ultrasound       Relevant Orders   Surgical pathology   Class 3 severe obesity without serious comorbidity with body mass index (BMI) of 40.0 to 44.9 in adult, unspecified obesity type (HCC)       Relevant Orders   TSH   FSH/LH   Estradiol   Prolactin      1) Discussed management options for abnormal uterine bleeding including expectant, NSAIDs, tranexamic acid (Lysteda), oral progesterone (Provera, norethindrone, megace), Depo Provera, Levonorgestrel containing IUD, endometrial ablation (Novasure) or hysterectomy as definitive surgical management.  Discussed risks and benefits of each method.   Final management decision will hinge on results of patient's work up and whether an underlying etiology for the patients bleeding symptoms can be discerned.  We  will conduct a basic work up examining using the PALM-COIEN classification system.  In the meantime the patient opted trial provera while we await results of her labs, although thus far this has not resulted in an appreciable decrease in bleeding for her.  The role of unopposed estrogen in the development of endometrial hyperplasia or carcinoma is discussed.  The risk of endometrial hyperplasia is linearly correlated with increasing BMI given the production of estrone by adipose tissue. Printed patient education handouts were given to the patient to review at home.  Bleeding precautions reviewed.  - leaning toward hysterectomy, discussed depo Lupron as well as  uterine artery embolization and a means of decreasing fibroid size.  2) Return if symptoms worsen or fail to improve.   Malachy Mood, MD, [facility], [facility] 07/31/2020, 10:12 AM

## 2020-08-01 ENCOUNTER — Other Ambulatory Visit: Payer: Self-pay | Admitting: Podiatry

## 2020-08-01 DIAGNOSIS — M722 Plantar fascial fibromatosis: Secondary | ICD-10-CM

## 2020-08-01 LAB — FSH/LH
FSH: 6.8 m[IU]/mL
LH: 5.9 m[IU]/mL

## 2020-08-01 LAB — CBC
Hematocrit: 35.5 % (ref 34.0–46.6)
Hemoglobin: 11.2 g/dL (ref 11.1–15.9)
MCH: 22.6 pg — ABNORMAL LOW (ref 26.6–33.0)
MCHC: 31.5 g/dL (ref 31.5–35.7)
MCV: 72 fL — ABNORMAL LOW (ref 79–97)
Platelets: 365 10*3/uL (ref 150–450)
RBC: 4.95 x10E6/uL (ref 3.77–5.28)
RDW: 16.3 % — ABNORMAL HIGH (ref 11.7–15.4)
WBC: 3.7 10*3/uL (ref 3.4–10.8)

## 2020-08-01 LAB — TSH: TSH: 1.01 u[IU]/mL (ref 0.450–4.500)

## 2020-08-01 LAB — ESTRADIOL: Estradiol: 166 pg/mL

## 2020-08-01 LAB — PROLACTIN: Prolactin: 9.4 ng/mL (ref 4.8–23.3)

## 2020-08-04 LAB — SURGICAL PATHOLOGY

## 2020-08-11 ENCOUNTER — Ambulatory Visit: Payer: BC Managed Care – PPO | Admitting: Internal Medicine

## 2020-08-13 ENCOUNTER — Telehealth: Payer: Self-pay

## 2020-08-13 NOTE — Telephone Encounter (Signed)
-----   Message from Malachy Mood, MD sent at 08/06/2020  4:36 PM EST ----- Regarding: Surgery Surgery Booking Request Patient Full Name:  Kathryn George  MRN: [id number]  DOB: 1976-06-20  Surgeon: Malachy Mood, MD  Requested Surgery Date and Time:  Primary Diagnosis AND Code: Uterine Fibroids Secondary Diagnosis and Code: AUB Surgical Procedure: TLH/BS RNFA Requested?: No L&D Notification: No Admission Status: observation Length of Surgery: 100 min Special Case Needs: No H&P: Yes Phone Interview???:  No Interpreter: No Medical Clearance:  No Special Scheduling Instructions: No Any known health/anesthesia issues, diabetes, sleep apnea, latex allergy, defibrillator/pacemaker?: No Acuity: P3   (P1 highest, P2 delay may cause harm, P3 low, elective gyn, P4 lowest)

## 2020-08-13 NOTE — Telephone Encounter (Signed)
Left a message for the patient to return the call.  

## 2020-08-13 NOTE — Telephone Encounter (Signed)
Patient rtn'd call to schedule TLH/BS w Georgianne Fick (potential conversion to laparotomy)  DOS 3/29  H&P 3/21 @ 8:10   Covid testing 3/25 @ 8-10:30, [address], drive up and wear mask. Advised pt to quarantine until DOS.  Pre-admit phone call appointment to be requested - date and time will be included on H&P paper work. Also all appointments will be updated on pt MyChart. Explained that this appointment has a call window. Based on the time scheduled will indicate if the call will be received within a 4 hour window before 1:00 or after.  Advised that pt may also receive calls from the hospital pharmacy and pre-service center.  Confirmed pt has [employer] as Chartered certified accountant. No secondary insurance.

## 2020-08-27 ENCOUNTER — Other Ambulatory Visit: Payer: Self-pay

## 2020-08-27 ENCOUNTER — Encounter: Payer: Self-pay | Admitting: Podiatry

## 2020-08-27 ENCOUNTER — Ambulatory Visit (INDEPENDENT_AMBULATORY_CARE_PROVIDER_SITE_OTHER): Payer: BC Managed Care – PPO | Admitting: Podiatry

## 2020-08-27 DIAGNOSIS — M722 Plantar fascial fibromatosis: Secondary | ICD-10-CM | POA: Diagnosis not present

## 2020-08-27 NOTE — Progress Notes (Signed)
She presents today for follow-up of her left heel states that she is about 70% better states that she has regressed from 100% to 70% and is taking the medication regularly.  States that seems to be working well for her.  Objective: Vital signs stable alert oriented x3 majority of her pain is on the lateral aspect of the left heel now.  No more medial pain.  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  Plan: Offered her another injection today she states that she would rather to stay on the oral medication.  So she is going to continue to do that and unguinal follow-up with her in 1 month if necessary.

## 2020-09-08 ENCOUNTER — Other Ambulatory Visit: Payer: Self-pay

## 2020-09-08 ENCOUNTER — Ambulatory Visit (INDEPENDENT_AMBULATORY_CARE_PROVIDER_SITE_OTHER): Payer: BC Managed Care – PPO | Admitting: Obstetrics and Gynecology

## 2020-09-08 ENCOUNTER — Other Ambulatory Visit: Admission: RE | Admit: 2020-09-08 | Payer: BC Managed Care – PPO | Source: Ambulatory Visit

## 2020-09-08 ENCOUNTER — Encounter: Payer: Self-pay | Admitting: Obstetrics and Gynecology

## 2020-09-08 VITALS — BP 124/76 | Ht 63.0 in | Wt 253.0 lb

## 2020-09-08 DIAGNOSIS — D252 Subserosal leiomyoma of uterus: Secondary | ICD-10-CM

## 2020-09-08 DIAGNOSIS — D251 Intramural leiomyoma of uterus: Secondary | ICD-10-CM

## 2020-09-08 DIAGNOSIS — N939 Abnormal uterine and vaginal bleeding, unspecified: Secondary | ICD-10-CM

## 2020-09-08 DIAGNOSIS — Z01818 Encounter for other preprocedural examination: Secondary | ICD-10-CM

## 2020-09-08 NOTE — H&P (View-Only) (Signed)
Obstetrics & Gynecology Surgery H&P    Chief Complaint: Scheduled Surgery   History of Present Illness: Patient is a 45 y.o. No obstetric history on file. presenting for scheduled TLH, BS, Cystoscopy , for the treatment or further evaluation of AUB and uterine fibroids.   Prior Treatments prior to proceeding with surgery include: discussion of hormonal management as well as [place] and hysteroscopy D&C  Preoperative Pap:10/02/2018 Results: no abnormalities  Preoperative Endometrial biopsy: 07/31/2020 Findings: benign endometrioid type polyp Preoperative Ultrasound: 07/24/2020 Findings: -anterior lower uterine segment intramural 3.2 x 2.0 x 1.5 cm fibroid  -anterior right uterine body intramural 3.9 x 2.7 x 2.2 cm fibroid  -left anterior uterine body intramural 3.6 x 2.6 x 2.3 cm fibroid  -fundal intramural 3.5 x 2.9 x 3.5 cm fibroid   Review of Systems:10 point review of systems  Past Medical History:  Patient Active Problem List   Diagnosis Date Noted  . Intramural and subserous leiomyoma of uterus 07/31/2020  . Abnormal uterine bleeding 07/31/2020  . Microcytic anemia 07/31/2020  . Vitamin D deficiency 10/15/2019    Vit D = 12  09/2019 - > recommended 2000 IU daily   . Migraine without aura and without status migrainosus, not intractable 10/15/2019  . Sebaceous cyst of skin of left breast 08/25/2018  . Hyperlipidemia, mixed 04/17/2017    The 10-year ASCVD risk score Mikey Bussing DC Brooke Bonito., et al., 2013) is: 0.7%   Values used to calculate the score:     Age: 34 years     Sex: Female     Is Non-Hispanic African American: Yes     Diabetic: No     Tobacco smoker: Yes     Systolic Blood Pressure: 656 mmHg     Is BP treated: No     HDL Cholesterol: 54 mg/dL     Total Cholesterol: 233 mg/dL    . Menorrhagia with regular cycle 10/04/2016  . Environmental and seasonal allergies 10/04/2016    Past Surgical History:  No past surgical history on file.  Family History:  Family  History  Problem Relation Age of Onset  . Breast cancer Mother 77  . Diabetes Mother   . Diabetes Maternal Aunt   . Cancer Paternal Aunt   . Diabetes Maternal Grandmother   . Dementia Maternal Grandmother   . Heart disease Maternal Grandfather 41  . Breast cancer Paternal Grandmother     Social History:  Social History   Socioeconomic History  . Marital status: Single    Spouse name: Not on file  . Number of children: Not on file  . Years of education: Not on file  . Highest education level: Not on file  Occupational History  . Not on file  Tobacco Use  . Smoking status: Former Smoker    Types: Cigars    Quit date: 06/21/2017    Years since quitting: 3.2  . Smokeless tobacco: Never Used  . Tobacco comment: started cigars 2 years ago.  Vaping Use  . Vaping Use: Never used  Substance and Sexual Activity  . Alcohol use: Yes    Comment: social  . Drug use: No  . Sexual activity: Yes    Birth control/protection: None  Other Topics Concern  . Not on file  Social History Narrative  . Not on file   Social Determinants of Health   Financial Resource Strain: Not on file  Food Insecurity: Not on file  Transportation Needs: Not on file  Physical Activity: Not on file  Stress: Not on file  Social Connections: Not on file  Intimate Partner Violence: Not on file    Allergies:  No Known Allergies  Medications: Prior to Admission medications   Medication Sig Start Date End Date Taking? Authorizing Provider  meloxicam (MOBIC) 15 MG tablet Take 1 tablet (15 mg total) by mouth daily. 07/30/20  Yes Hyatt, Max T, DPM  medroxyPROGESTERone (PROVERA) 10 MG tablet Take 1 tablet (10 mg total) by mouth daily. Patient not taking: Reported on 08/26/2020 07/24/20   Coral Spikes, DO  naproxen sodium (ALEVE) 220 MG tablet Take 440 mg by mouth daily as needed (pain).    [provider]    Physical Exam Vitals: Blood pressure 124/76, height 5\' 3"  (1.6 m), weight 253 lb (114.8 kg),  last menstrual period 09/07/2020. General: NAD HEENT: normocephalic, anicteric Pulmonary: No increased work of breathing, CTAB Cardiovascular: RRR, distal pulses 2+ Abdomen: soft, non-tender, non-distended Genitourinary: deferred  Extremities: no edema, erythema, or tenderness Neurologic: Grossly intact Psychiatric: mood appropriate, affect full  Imaging No results found.  Assessment: 45 y.o.  presenting for scheduled TLH, BS, cystoscopy for AUB and uterine fibroids  Plan: 1) Patient opts for definitive surgical management via hysterectomy. The risks of surgery were discussed in detail with the patient including but not limited to: bleeding which may require transfusion or reoperation; infection which may require antibiotics; injury to bowel, bladder, ureters or other surrounding organs (With a literature reported rate of urinary tract injury of 1% quoted); need for additional procedures including laparotomy; thromboembolic phenomenon, incisional problems and other postoperative/anesthesia complications.  Patient was also advised that recovery procedure generally involves an overnight stay; and the  expected recovery time after a hysterectomy being in the range of 6-8 weeks.  Likelihood of success in alleviating the patient's symptoms was discussed.  While definitive in regards to issues with menstural bleeding, pelvic pain if present preoperatively may continue and in fact worsen postoperatively.  She is aware that the procedure will render her unable to pursue childbearing in the future.   She was told that she will be contacted by our surgical scheduler regarding the time and date of her surgery; routine preoperative instructions of having nothing to eat or drink after midnight on the day prior to surgery and also coming to the hospital 1.5 hours prior to her time of surgery were also emphasized.  She was told she may be called for a preoperative appointment about a week prior to surgery and will  be given further preoperative instructions at that visit.  Routine postoperative instructions will be reviewed with the patient and her family in detail after surgery. Printed patient education handouts about the procedure was given to the patient to review at home.   2) Routine postoperative instructions were reviewed with the patient and her family in detail today including the expected length of recovery and likely postoperative course.  The patient concurred with the proposed plan, giving informed written consent for the surgery today.  Patient instructed on the importance of being NPO after midnight prior to her procedure.  If warranted preoperative prophylactic antibiotics and SCDs ordered on call to the OR to meet SCIP guidelines and adhere to recommendation laid forth in [place] Number 104 May 2009  "Antibiotic Prophylaxis for Gynecologic Procedures".     Malachy Mood, MD, [facility], [facility] 09/08/2020, 9:12 AM

## 2020-09-08 NOTE — Progress Notes (Signed)
Obstetrics & Gynecology Surgery H&P    Chief Complaint: Scheduled Surgery   History of Present Illness: Patient is a 45 y.o. No obstetric history on file. presenting for scheduled TLH, BS, Cystoscopy , for the treatment or further evaluation of AUB and uterine fibroids.   Prior Treatments prior to proceeding with surgery include: discussion of hormonal management as well as [place] and hysteroscopy D&C  Preoperative Pap:10/02/2018 Results: no abnormalities  Preoperative Endometrial biopsy: 07/31/2020 Findings: benign endometrioid type polyp Preoperative Ultrasound: 07/24/2020 Findings: -anterior lower uterine segment intramural 3.2 x 2.0 x 1.5 cm fibroid  -anterior right uterine body intramural 3.9 x 2.7 x 2.2 cm fibroid  -left anterior uterine body intramural 3.6 x 2.6 x 2.3 cm fibroid  -fundal intramural 3.5 x 2.9 x 3.5 cm fibroid   Review of Systems:10 point review of systems  Past Medical History:  Patient Active Problem List   Diagnosis Date Noted  . Intramural and subserous leiomyoma of uterus 07/31/2020  . Abnormal uterine bleeding 07/31/2020  . Microcytic anemia 07/31/2020  . Vitamin D deficiency 10/15/2019    Vit D = 12  09/2019 - > recommended 2000 IU daily   . Migraine without aura and without status migrainosus, not intractable 10/15/2019  . Sebaceous cyst of skin of left breast 08/25/2018  . Hyperlipidemia, mixed 04/17/2017    The 10-year ASCVD risk score Mikey Bussing DC Brooke Bonito., et al., 2013) is: 0.7%   Values used to calculate the score:     Age: 43 years     Sex: Female     Is Non-Hispanic African American: Yes     Diabetic: No     Tobacco smoker: Yes     Systolic Blood Pressure: 315 mmHg     Is BP treated: No     HDL Cholesterol: 54 mg/dL     Total Cholesterol: 233 mg/dL    . Menorrhagia with regular cycle 10/04/2016  . Environmental and seasonal allergies 10/04/2016    Past Surgical History:  No past surgical history on file.  Family History:  Family  History  Problem Relation Age of Onset  . Breast cancer Mother 74  . Diabetes Mother   . Diabetes Maternal Aunt   . Cancer Paternal Aunt   . Diabetes Maternal Grandmother   . Dementia Maternal Grandmother   . Heart disease Maternal Grandfather 22  . Breast cancer Paternal Grandmother     Social History:  Social History   Socioeconomic History  . Marital status: Single    Spouse name: Not on file  . Number of children: Not on file  . Years of education: Not on file  . Highest education level: Not on file  Occupational History  . Not on file  Tobacco Use  . Smoking status: Former Smoker    Types: Cigars    Quit date: 06/21/2017    Years since quitting: 3.2  . Smokeless tobacco: Never Used  . Tobacco comment: started cigars 2 years ago.  Vaping Use  . Vaping Use: Never used  Substance and Sexual Activity  . Alcohol use: Yes    Comment: social  . Drug use: No  . Sexual activity: Yes    Birth control/protection: None  Other Topics Concern  . Not on file  Social History Narrative  . Not on file   Social Determinants of Health   Financial Resource Strain: Not on file  Food Insecurity: Not on file  Transportation Needs: Not on file  Physical Activity: Not on file  Stress: Not on file  Social Connections: Not on file  Intimate Partner Violence: Not on file    Allergies:  No Known Allergies  Medications: Prior to Admission medications   Medication Sig Start Date End Date Taking? Authorizing Provider  meloxicam (MOBIC) 15 MG tablet Take 1 tablet (15 mg total) by mouth daily. 07/30/20  Yes Hyatt, Max T, DPM  medroxyPROGESTERone (PROVERA) 10 MG tablet Take 1 tablet (10 mg total) by mouth daily. Patient not taking: Reported on 08/26/2020 07/24/20   Coral Spikes, DO  naproxen sodium (ALEVE) 220 MG tablet Take 440 mg by mouth daily as needed (pain).    [provider]    Physical Exam Vitals: Blood pressure 124/76, height 5\' 3"  (1.6 m), weight 253 lb (114.8 kg),  last menstrual period 09/07/2020. General: NAD HEENT: normocephalic, anicteric Pulmonary: No increased work of breathing, CTAB Cardiovascular: RRR, distal pulses 2+ Abdomen: soft, non-tender, non-distended Genitourinary: deferred  Extremities: no edema, erythema, or tenderness Neurologic: Grossly intact Psychiatric: mood appropriate, affect full  Imaging No results found.  Assessment: 45 y.o.  presenting for scheduled TLH, BS, cystoscopy for AUB and uterine fibroids  Plan: 1) Patient opts for definitive surgical management via hysterectomy. The risks of surgery were discussed in detail with the patient including but not limited to: bleeding which may require transfusion or reoperation; infection which may require antibiotics; injury to bowel, bladder, ureters or other surrounding organs (With a literature reported rate of urinary tract injury of 1% quoted); need for additional procedures including laparotomy; thromboembolic phenomenon, incisional problems and other postoperative/anesthesia complications.  Patient was also advised that recovery procedure generally involves an overnight stay; and the  expected recovery time after a hysterectomy being in the range of 6-8 weeks.  Likelihood of success in alleviating the patient's symptoms was discussed.  While definitive in regards to issues with menstural bleeding, pelvic pain if present preoperatively may continue and in fact worsen postoperatively.  She is aware that the procedure will render her unable to pursue childbearing in the future.   She was told that she will be contacted by our surgical scheduler regarding the time and date of her surgery; routine preoperative instructions of having nothing to eat or drink after midnight on the day prior to surgery and also coming to the hospital 1.5 hours prior to her time of surgery were also emphasized.  She was told she may be called for a preoperative appointment about a week prior to surgery and will  be given further preoperative instructions at that visit.  Routine postoperative instructions will be reviewed with the patient and her family in detail after surgery. Printed patient education handouts about the procedure was given to the patient to review at home.   2) Routine postoperative instructions were reviewed with the patient and her family in detail today including the expected length of recovery and likely postoperative course.  The patient concurred with the proposed plan, giving informed written consent for the surgery today.  Patient instructed on the importance of being NPO after midnight prior to her procedure.  If warranted preoperative prophylactic antibiotics and SCDs ordered on call to the OR to meet SCIP guidelines and adhere to recommendation laid forth in [facility] Number 104 May 2009  "Antibiotic Prophylaxis for Gynecologic Procedures".     Malachy Mood, MD, [facility], [facility] 09/08/2020, 9:12 AM

## 2020-09-10 ENCOUNTER — Other Ambulatory Visit: Payer: Self-pay

## 2020-09-10 ENCOUNTER — Encounter
Admission: RE | Admit: 2020-09-10 | Discharge: 2020-09-10 | Disposition: A | Discharge status: Home / Self Care | Payer: BC Managed Care – PPO | Source: Ambulatory Visit | Attending: Obstetrics and Gynecology | Admitting: Obstetrics and Gynecology

## 2020-09-10 HISTORY — DX: Cardiac murmur, unspecified: R01.1

## 2020-09-10 HISTORY — DX: Gastro-esophageal reflux disease without esophagitis: K21.9

## 2020-09-10 HISTORY — DX: Headache, unspecified: R51.9

## 2020-09-10 NOTE — Patient Instructions (Signed)
Your procedure is scheduled on:09-16-20 TUESDAY Report to the Registration Desk on the 1st floor of the [facility]-Then proceed to the 2nd floor Surgery Desk in the [facility] To find out your arrival time, please call [phone] between 1PM - 3PM on:09-15-20 MONDAY  REMEMBER: Instructions that are not followed completely may result in serious medical risk, up to and including death; or upon the discretion of your surgeon and anesthesiologist your surgery may need to be rescheduled.  Do not eat food after midnight the night before surgery.  No gum chewing, lozengers or hard candies.  You may however, drink CLEAR liquids up to 2 hours before you are scheduled to arrive for your surgery. Do not drink anything within 2 hours of your scheduled arrival time.  Clear liquids include: - water  - apple juice without pulp - gatorade (not RED, PURPLE, OR BLUE) - black coffee or tea (Do NOT add milk or creamers to the coffee or tea) Do NOT drink anything that is not on this list.  DO NOT TAKE ANY MEDICATION THE MORNING OF SURGERY  One week prior to surgery: Stop Anti-inflammatories (NSAIDS) such as MOBIC (MELOXICAM), Advil, Aleve, Ibuprofen, Motrin, Naproxen, Naprosyn and Aspirin based products such as Excedrin, Goodys Powder, BC Powder-OK TO TAKE TYLENOL IF NEEDED  Stop ANY OVER THE COUNTER supplements until after surgery.  No Alcohol for 24 hours before or after surgery.  No Smoking including e-cigarettes for 24 hours prior to surgery.  No chewable tobacco products for at least 6 hours prior to surgery.  No nicotine patches on the day of surgery.  Do not use any "recreational" drugs for at least a week prior to your surgery.  Please be advised that the combination of cocaine and anesthesia may have negative outcomes, up to and including death. If you test positive for cocaine, your surgery will be cancelled.  On the morning of surgery brush your teeth with toothpaste and water, you  may rinse your mouth with mouthwash if you wish. Do not swallow any toothpaste or mouthwash.  Do not wear jewelry, make-up, hairpins, clips or nail polish.  Do not wear lotions, powders, or perfumes.   Do not shave body from the neck down 48 hours prior to surgery just in case you cut yourself which could leave a site for infection.  Also, freshly shaved skin may become irritated if using the CHG soap.  Contact lenses, hearing aids and dentures may not be worn into surgery.  Do not bring valuables to the hospital. The [facility] is not responsible for any missing/lost belongings or valuables.   Use CHG Soap as directed on instruction sheet.  Notify your doctor if there is any change in your medical condition (cold, fever, infection).  Wear comfortable clothing (specific to your surgery type) to the hospital.  Plan for stool softeners for home use; pain medications have a tendency to cause constipation. You can also help prevent constipation by eating foods high in fiber such as fruits and vegetables and drinking plenty of fluids as your diet allows.  After surgery, you can help prevent lung complications by doing breathing exercises.  Take deep breaths and cough every 1-2 hours. Your doctor may order a device called an Incentive Spirometer to help you take deep breaths. When coughing or sneezing, hold a pillow firmly against your incision with both hands. This is called "splinting." Doing this helps protect your incision. It also decreases belly discomfort.  If you are being admitted to the  hospital overnight, leave your suitcase in the car. After surgery it may be brought to your room.  If you are being discharged the day of surgery, you will not be allowed to drive home. You will need a responsible adult (18 years or older) to drive you home and stay with you that night.   If you are taking public transportation, you will need to have a responsible adult (18 years or older) with  you. Please confirm with your physician that it is acceptable to use public transportation.   Please call the [facility] at [phone] if you have any questions about these instructions.  Surgery Visitation Policy:  Patients undergoing a surgery or procedure may have one family member or support person with them as long as that person is not COVID-19 positive or experiencing its symptoms.  That person may remain in the waiting area during the procedure.  Inpatient Visitation:    Visiting hours are 7 a.m. to 8 p.m. Inpatients will be allowed two visitors daily. The visitors may change each day during the patient's stay. No visitors under the age of 43. Any visitor under the age of 73 must be accompanied by an adult. The visitor must pass COVID-19 screenings, use hand sanitizer when entering and exiting the patient's room and wear a mask at all times, including in the patient's room. Patients must also wear a mask when staff or their visitor are in the room. Masking is required regardless of vaccination status.

## 2020-09-12 ENCOUNTER — Other Ambulatory Visit: Payer: Self-pay

## 2020-09-12 ENCOUNTER — Encounter
Admission: RE | Admit: 2020-09-12 | Discharge: 2020-09-12 | Disposition: A | Discharge status: Home / Self Care | Payer: BC Managed Care – PPO | Source: Ambulatory Visit | Attending: Obstetrics and Gynecology | Admitting: Obstetrics and Gynecology

## 2020-09-12 ENCOUNTER — Other Ambulatory Visit: Payer: BC Managed Care – PPO

## 2020-09-12 DIAGNOSIS — Z20822 Contact with and (suspected) exposure to covid-19: Secondary | ICD-10-CM | POA: Diagnosis not present

## 2020-09-12 DIAGNOSIS — Z01812 Encounter for preprocedural laboratory examination: Secondary | ICD-10-CM | POA: Diagnosis present

## 2020-09-12 LAB — BASIC METABOLIC PANEL
Anion gap: 8 (ref 5–15)
BUN: 14 mg/dL (ref 6–20)
CO2: 23 mmol/L (ref 22–32)
Calcium: 8.7 mg/dL — ABNORMAL LOW (ref 8.9–10.3)
Chloride: 105 mmol/L (ref 98–111)
Creatinine, Ser: 0.71 mg/dL (ref 0.44–1.00)
GFR, Estimated: >60 mL/min (ref >60)
Glucose, Bld: 93 mg/dL (ref 70–99)
Potassium: 3.6 mmol/L (ref 3.5–5.1)
Sodium: 136 mmol/L (ref 135–145)

## 2020-09-12 LAB — TYPE AND SCREEN
ABO/RH(D): A POS
Antibody Screen: NEGATIVE

## 2020-09-12 LAB — CBC
HCT: 30.9 % — ABNORMAL LOW (ref 36.0–46.0)
Hemoglobin: 9.5 g/dL — ABNORMAL LOW (ref 12.0–15.0)
MCH: 21.7 pg — ABNORMAL LOW (ref 26.0–34.0)
MCHC: 30.7 g/dL (ref 30.0–36.0)
MCV: 70.7 fL — ABNORMAL LOW (ref 80.0–100.0)
Platelets: 445 10*3/uL — ABNORMAL HIGH (ref 150–400)
RBC: 4.37 MIL/uL (ref 3.87–5.11)
RDW: 15.9 % — ABNORMAL HIGH (ref 11.5–15.5)
WBC: 4.2 10*3/uL (ref 4.0–10.5)
nRBC: 0 % (ref 0.0–0.2)

## 2020-09-13 LAB — SARS CORONAVIRUS 2 (TAT 6-24 HRS): SARS Coronavirus 2: NEGATIVE

## 2020-09-15 MED ORDER — CEFAZOLIN SODIUM-DEXTROSE 2-4 GM/100ML-% IV SOLN
2.0000 g | INTRAVENOUS | Status: AC
  Administered 2020-09-16: 2 g via INTRAVENOUS
  Administered 2020-09-16: 1 g via INTRAVENOUS

## 2020-09-15 MED ORDER — LACTATED RINGERS IV SOLN: INTRAVENOUS | Status: DC

## 2020-09-15 MED ORDER — ORAL CARE MOUTH RINSE: 15.0000 mL | Freq: Once | OROMUCOSAL | Status: AC

## 2020-09-15 MED ORDER — POVIDONE-IODINE 10 % EX SWAB: 2.0000 "application " | Freq: Once | CUTANEOUS | Status: DC

## 2020-09-15 MED ORDER — CHLORHEXIDINE GLUCONATE 0.12 % MT SOLN: 15.0000 mL | Freq: Once | OROMUCOSAL | Status: AC

## 2020-09-15 MED ORDER — FAMOTIDINE 20 MG PO TABS
20.0000 mg | ORAL_TABLET | Freq: Once | ORAL | Status: AC
  Administered 2020-09-16: 20 mg via ORAL

## 2020-09-16 ENCOUNTER — Other Ambulatory Visit: Payer: Self-pay

## 2020-09-16 ENCOUNTER — Ambulatory Visit
Admission: RE | Admit: 2020-09-16 | Discharge: 2020-09-16 | Disposition: A | Discharge status: Home / Self Care | Payer: BC Managed Care – PPO | Attending: Obstetrics and Gynecology | Admitting: Obstetrics and Gynecology

## 2020-09-16 ENCOUNTER — Encounter: Payer: Self-pay | Admitting: Obstetrics and Gynecology

## 2020-09-16 ENCOUNTER — Observation Stay: Payer: BC Managed Care – PPO | Admitting: Anesthesiology

## 2020-09-16 ENCOUNTER — Encounter
Admission: RE | Disposition: A | Discharge status: Home / Self Care | Payer: Self-pay | Source: Home / Self Care | Attending: Obstetrics and Gynecology

## 2020-09-16 DIAGNOSIS — N939 Abnormal uterine and vaginal bleeding, unspecified: Secondary | ICD-10-CM

## 2020-09-16 DIAGNOSIS — Z791 Long term (current) use of non-steroidal anti-inflammatories (NSAID): Secondary | ICD-10-CM | POA: Diagnosis not present

## 2020-09-16 DIAGNOSIS — D251 Intramural leiomyoma of uterus: Secondary | ICD-10-CM

## 2020-09-16 DIAGNOSIS — D252 Subserosal leiomyoma of uterus: Secondary | ICD-10-CM

## 2020-09-16 DIAGNOSIS — D250 Submucous leiomyoma of uterus: Secondary | ICD-10-CM

## 2020-09-16 DIAGNOSIS — N840 Polyp of corpus uteri: Secondary | ICD-10-CM | POA: Insufficient documentation

## 2020-09-16 DIAGNOSIS — Z87891 Personal history of nicotine dependence: Secondary | ICD-10-CM | POA: Insufficient documentation

## 2020-09-16 HISTORY — PX: DILATION AND CURETTAGE OF UTERUS: SHX78

## 2020-09-16 HISTORY — PX: MYOMECTOMY: SHX85

## 2020-09-16 LAB — POCT PREGNANCY, URINE: Preg Test, Ur: NEGATIVE

## 2020-09-16 LAB — ABO/RH: ABO/RH(D): A POS

## 2020-09-16 SURGERY — MYOMECTOMY, UTERUS, VAGINAL APPROACH
Anesthesia: General | Laterality: Bilateral

## 2020-09-16 MED ORDER — ACETAMINOPHEN 10 MG/ML IV SOLN
INTRAVENOUS | Status: DC | PRN
  Administered 2020-09-16: 1000 mg via INTRAVENOUS

## 2020-09-16 MED ORDER — CHLORHEXIDINE GLUCONATE 0.12 % MT SOLN
OROMUCOSAL | Status: AC
  Administered 2020-09-16: 15 mL via OROMUCOSAL
  Filled 2020-09-16: qty 15

## 2020-09-16 MED ORDER — FENTANYL CITRATE (PF) 100 MCG/2ML IJ SOLN
INTRAMUSCULAR | Status: DC | PRN
  Administered 2020-09-16: 50 ug via INTRAVENOUS

## 2020-09-16 MED ORDER — KETAMINE HCL 50 MG/5ML IJ SOSY
PREFILLED_SYRINGE | INTRAMUSCULAR | Status: AC
  Filled 2020-09-16: qty 5

## 2020-09-16 MED ORDER — GLYCOPYRROLATE 0.2 MG/ML IJ SOLN
INTRAMUSCULAR | Status: DC | PRN
  Administered 2020-09-16: .2 mg via INTRAVENOUS

## 2020-09-16 MED ORDER — ACETAMINOPHEN 10 MG/ML IV SOLN
INTRAVENOUS | Status: AC
  Filled 2020-09-16: qty 100

## 2020-09-16 MED ORDER — MIDAZOLAM HCL 2 MG/2ML IJ SOLN
INTRAMUSCULAR | Status: AC
  Filled 2020-09-16: qty 2

## 2020-09-16 MED ORDER — SUCCINYLCHOLINE CHLORIDE 20 MG/ML IJ SOLN
INTRAMUSCULAR | Status: DC | PRN
  Administered 2020-09-16: 200 mg via INTRAVENOUS

## 2020-09-16 MED ORDER — KETAMINE HCL 10 MG/ML IJ SOLN
INTRAMUSCULAR | Status: DC | PRN
  Administered 2020-09-16: 30 mg via INTRAVENOUS

## 2020-09-16 MED ORDER — PROPOFOL 10 MG/ML IV BOLUS
INTRAVENOUS | Status: DC | PRN
  Administered 2020-09-16: 200 mg via INTRAVENOUS

## 2020-09-16 MED ORDER — FAMOTIDINE 20 MG PO TABS
ORAL_TABLET | ORAL | Status: AC
  Filled 2020-09-16: qty 1

## 2020-09-16 MED ORDER — PHENYLEPHRINE HCL (PRESSORS) 10 MG/ML IV SOLN
INTRAVENOUS | Status: DC | PRN
  Administered 2020-09-16: 100 ug via INTRAVENOUS

## 2020-09-16 MED ORDER — OXYCODONE HCL 5 MG/5ML PO SOLN: 5.0000 mg | Freq: Once | ORAL | Status: DC | PRN

## 2020-09-16 MED ORDER — CEFAZOLIN SODIUM-DEXTROSE 2-4 GM/100ML-% IV SOLN
INTRAVENOUS | Status: AC
  Filled 2020-09-16: qty 100

## 2020-09-16 MED ORDER — SODIUM CHLORIDE 0.9 % IV SOLN
INTRAVENOUS | Status: DC | PRN
  Administered 2020-09-16: 25 ug/min via INTRAVENOUS

## 2020-09-16 MED ORDER — SUGAMMADEX SODIUM 500 MG/5ML IV SOLN
INTRAVENOUS | Status: DC | PRN
  Administered 2020-09-16: 500 mg via INTRAVENOUS

## 2020-09-16 MED ORDER — EPHEDRINE SULFATE 50 MG/ML IJ SOLN
INTRAMUSCULAR | Status: DC | PRN
  Administered 2020-09-16 (×2): 5 mg via INTRAVENOUS

## 2020-09-16 MED ORDER — FENTANYL CITRATE (PF) 100 MCG/2ML IJ SOLN: 25.0000 ug | INTRAMUSCULAR | Status: DC | PRN

## 2020-09-16 MED ORDER — LIDOCAINE HCL (CARDIAC) PF 100 MG/5ML IV SOSY
PREFILLED_SYRINGE | INTRAVENOUS | Status: DC | PRN
  Administered 2020-09-16: 100 mg via INTRAVENOUS

## 2020-09-16 MED ORDER — LIDOCAINE HCL (PF) 2 % IJ SOLN
INTRAMUSCULAR | Status: AC
  Filled 2020-09-16: qty 5

## 2020-09-16 MED ORDER — DEXMEDETOMIDINE (PRECEDEX) IN NS 20 MCG/5ML (4 MCG/ML) IV SYRINGE
PREFILLED_SYRINGE | INTRAVENOUS | Status: DC | PRN
  Administered 2020-09-16: 12 ug via INTRAVENOUS
  Administered 2020-09-16: 4 ug via INTRAVENOUS

## 2020-09-16 MED ORDER — ONDANSETRON HCL 4 MG/2ML IJ SOLN
INTRAMUSCULAR | Status: DC | PRN
  Administered 2020-09-16 (×2): 4 mg via INTRAVENOUS

## 2020-09-16 MED ORDER — ROCURONIUM BROMIDE 100 MG/10ML IV SOLN
INTRAVENOUS | Status: DC | PRN
  Administered 2020-09-16: 50 mg via INTRAVENOUS

## 2020-09-16 MED ORDER — HYDROCODONE-ACETAMINOPHEN 5-325 MG PO TABS: 1.0000 | ORAL_TABLET | Freq: Four times a day (QID) | ORAL | 0 refills | Status: DC | PRN

## 2020-09-16 MED ORDER — FENTANYL CITRATE (PF) 100 MCG/2ML IJ SOLN
INTRAMUSCULAR | Status: AC
  Filled 2020-09-16: qty 2

## 2020-09-16 MED ORDER — OXYCODONE HCL 5 MG PO TABS: 5.0000 mg | ORAL_TABLET | Freq: Once | ORAL | Status: DC | PRN

## 2020-09-16 MED ORDER — DEXAMETHASONE SODIUM PHOSPHATE 10 MG/ML IJ SOLN
INTRAMUSCULAR | Status: DC | PRN
  Administered 2020-09-16: 10 mg via INTRAVENOUS

## 2020-09-16 MED ORDER — MIDAZOLAM HCL 2 MG/2ML IJ SOLN
INTRAMUSCULAR | Status: DC | PRN
  Administered 2020-09-16: 2 mg via INTRAVENOUS

## 2020-09-16 MED ORDER — HYDROMORPHONE HCL 1 MG/ML IJ SOLN
INTRAMUSCULAR | Status: AC
  Filled 2020-09-16: qty 1

## 2020-09-16 SURGICAL SUPPLY — 51 items
APPLICATOR ARISTA FLEXITIP XL (MISCELLANEOUS) IMPLANT
BAG URINE DRAIN 2000ML AR STRL (UROLOGICAL SUPPLIES) ×3 IMPLANT
BLADE SURG SZ11 CARB STEEL (BLADE) IMPLANT
CATH FOLEY 2WAY  5CC 16FR (CATHETERS) ×1
CATH URTH 16FR FL 2W BLN LF (CATHETERS) ×2 IMPLANT
CHLORAPREP W/TINT 26 (MISCELLANEOUS) ×3 IMPLANT
DEFOGGER SCOPE WARMER CLEARIFY (MISCELLANEOUS) IMPLANT
DERMABOND ADVANCED (GAUZE/BANDAGES/DRESSINGS)
DERMABOND ADVANCED .7 DNX12 (GAUZE/BANDAGES/DRESSINGS) IMPLANT
DEVICE SUTURE ENDOST 10MM (ENDOMECHANICALS) IMPLANT
GAUZE 4X4 16PLY RFD (DISPOSABLE) ×3 IMPLANT
GLOVE SURG ENC MOIS LTX SZ7 (GLOVE) ×9 IMPLANT
GLOVE SURG UNDER LTX SZ7.5 (GLOVE) ×9 IMPLANT
GOWN STRL REUS W/ TWL LRG LVL3 (GOWN DISPOSABLE) ×2 IMPLANT
GOWN STRL REUS W/ TWL XL LVL3 (GOWN DISPOSABLE) ×4 IMPLANT
GOWN STRL REUS W/TWL LRG LVL3 (GOWN DISPOSABLE) ×1
GOWN STRL REUS W/TWL XL LVL3 (GOWN DISPOSABLE) ×2
GRASPER SUT TROCAR 14GX15 (MISCELLANEOUS) IMPLANT
HEMOSTAT ARISTA ABSORB 3G PWDR (HEMOSTASIS) IMPLANT
IRRIGATION STRYKERFLOW (MISCELLANEOUS) IMPLANT
IRRIGATOR STRYKERFLOW (MISCELLANEOUS)
IV LACTATED RINGERS 1000ML (IV SOLUTION) IMPLANT
IV NS 1000ML (IV SOLUTION)
IV NS 1000ML BAXH (IV SOLUTION) IMPLANT
KIT PINK PAD W/HEAD ARE REST (MISCELLANEOUS) ×3
KIT PINK PAD W/HEAD ARM REST (MISCELLANEOUS) ×2 IMPLANT
LABEL OR SOLS (LABEL) IMPLANT
MANIFOLD NEPTUNE II (INSTRUMENTS) IMPLANT
MANIPULATOR VCARE LG CRV RETR (MISCELLANEOUS) IMPLANT
MANIPULATOR VCARE SML CRV RETR (MISCELLANEOUS) IMPLANT
MANIPULATOR VCARE STD CRV RETR (MISCELLANEOUS) IMPLANT
NS IRRIG 500ML POUR BTL (IV SOLUTION) ×3 IMPLANT
OCCLUDER COLPOPNEUMO (BALLOONS) IMPLANT
PACK GYN LAPAROSCOPIC (MISCELLANEOUS) ×3 IMPLANT
PAD OB MATERNITY 4.3X12.25 (PERSONAL CARE ITEMS) ×3 IMPLANT
PAD PREP 24X41 OB/GYN DISP (PERSONAL CARE ITEMS) ×3 IMPLANT
SCISSORS METZENBAUM CVD 33 (INSTRUMENTS) IMPLANT
SET CYSTO W/LG BORE CLAMP LF (SET/KITS/TRAYS/PACK) IMPLANT
SHEARS HARMONIC ACE PLUS 36CM (ENDOMECHANICALS) IMPLANT
SLEEVE ENDOPATH XCEL 5M (ENDOMECHANICALS) IMPLANT
STRAP SAFETY 5IN WIDE (MISCELLANEOUS) IMPLANT
SUT ENDO VLOC 180-0-8IN (SUTURE) IMPLANT
SUT MNCRL 4-0 (SUTURE)
SUT MNCRL 4-0 27XMFL (SUTURE)
SUT VIC AB 0 CT1 36 (SUTURE) IMPLANT
SUTURE MNCRL 4-0 27XMF (SUTURE) IMPLANT
SYR 10ML LL (SYRINGE) ×3 IMPLANT
SYR 50ML LL SCALE MARK (SYRINGE) IMPLANT
TROCAR ENDO BLADELESS 11MM (ENDOMECHANICALS) IMPLANT
TROCAR XCEL NON-BLD 5MMX100MML (ENDOMECHANICALS) IMPLANT
TUBING EVAC SMOKE HEATED PNEUM (TUBING) IMPLANT

## 2020-09-16 NOTE — Discharge Instructions (Signed)

## 2020-09-16 NOTE — Anesthesia Preprocedure Evaluation (Signed)
Anesthesia Evaluation  Patient identified by MRN, date of birth, ID band Patient awake    Reviewed: Allergy & Precautions, H&P , NPO status , Patient's Chart, lab work & pertinent test results  History of Anesthesia Complications Negative for: history of anesthetic complications  Airway Mallampati: III  TM Distance: >3 FB Neck ROM: full    Dental  (+) Chipped   Pulmonary neg shortness of breath, former smoker,    Pulmonary exam normal        Cardiovascular (-) angina(-) Past MI Normal cardiovascular exam     Neuro/Psych  Headaches, negative psych ROS   GI/Hepatic Neg liver ROS, GERD  Medicated and Controlled,  Endo/Other  Morbid obesity  Renal/GU      Musculoskeletal   Abdominal   Peds  Hematology negative hematology ROS (+)   Anesthesia Other Findings Past Medical History: No date: Allergy     Comment:  year round allergies- eyes water. nose runs.  2014: Anxiety No date: GERD (gastroesophageal reflux disease)     Comment:  no meds No date: Headache     Comment:  migraines No date: Heart murmur     Comment:  h/o during pregnancy 2014: Insomnia  Past Surgical History: No date: NO PAST SURGERIES  BMI    Body Mass Index: 44.82 kg/m      Reproductive/Obstetrics negative OB ROS                             Anesthesia Physical Anesthesia Plan  ASA: III  Anesthesia Plan: General ETT   Post-op Pain Management:    Induction: Intravenous  PONV Risk Score and Plan: Ondansetron, Dexamethasone, Midazolam and Treatment may vary due to age or medical condition  Airway Management Planned: Oral ETT  Additional Equipment:   Intra-op Plan:   Post-operative Plan: Extubation in OR  Informed Consent: I have reviewed the patients History and Physical, chart, labs and discussed the procedure including the risks, benefits and alternatives for the proposed anesthesia with the patient or  authorized representative who has indicated his/her understanding and acceptance.     Dental Advisory Given  Plan Discussed with: Anesthesiologist, CRNA and Surgeon  Anesthesia Plan Comments: (Patient consented for risks of anesthesia including but not limited to:  - adverse reactions to medications - damage to eyes, teeth, lips or other oral mucosa - nerve damage due to positioning  - sore throat or hoarseness - Damage to heart, brain, nerves, lungs, other parts of body or loss of life  Patient voiced understanding.)        Anesthesia Quick Evaluation

## 2020-09-16 NOTE — Transfer of Care (Signed)
Immediate Anesthesia Transfer of Care Note  Patient: Kathryn George  Procedure(s) Performed: VAGINAL MYOMECTOMY DILATATION AND CURETTAGE  Patient Location: PACU  Anesthesia Type:General  Level of Consciousness: drowsy and patient cooperative  Airway & Oxygen Therapy: Patient Spontanous Breathing and Patient connected to face mask oxygen  Post-op Assessment: Report given to RN and Post -op Vital signs reviewed and stable  Post vital signs: Reviewed and stable  Last Vitals:  Vitals Value Taken Time  BP 151/92 09/16/20 0840  Temp    Pulse 69 09/16/20 0844  Resp 13 09/16/20 0844  SpO2 100 % 09/16/20 0844  Vitals shown include unvalidated device data.  Last Pain:  Vitals:   09/16/20 0625  TempSrc: Tympanic  PainSc: 3          Complications: No complications documented.

## 2020-09-16 NOTE — Anesthesia Postprocedure Evaluation (Signed)
Anesthesia Post Note  Patient: Kathryn George  Procedure(s) Performed: VAGINAL MYOMECTOMY DILATATION AND CURETTAGE  Patient location during evaluation: PACU Anesthesia Type: General Level of consciousness: awake and alert Pain management: pain level controlled Vital Signs Assessment: post-procedure vital signs reviewed and stable Respiratory status: spontaneous breathing, nonlabored ventilation, respiratory function stable and patient connected to nasal cannula oxygen Cardiovascular status: blood pressure returned to baseline and stable Postop Assessment: no apparent nausea or vomiting Anesthetic complications: no   No complications documented.   Last Vitals:  Vitals:   09/16/20 0925 09/16/20 0937  BP:  129/76  Pulse: 65 63  Resp: 15 12  Temp:    SpO2: 100% 100%    Last Pain:  Vitals:   09/16/20 0937  TempSrc:   PainSc: 0-No pain                 Precious Haws Maleeha Halls

## 2020-09-16 NOTE — Op Note (Addendum)
Preoperative Diagnosis: 1) 45 y.o. with abnormal uterine bleeding 2) Uterine fibroids  Postoperative Diagnosis: 1) 45 y.o. with abnormal uterine bleeding 2) Uterine fibroids 3 Prolapsing 4cm submucosal fibroid  Operation Performed: Vaginal myomectomy  Indication: 45 y.o. presenting for planned total laparoscopic hysterectomy, bilateral salpingectomy, and cystoscopy.  On placement of the speculum a prolapsing fibroid was noted which had not been present at the time of preoperative endometrial biopsy 07/31/2020  Anesthesia: General  Primary Surgeon: Malachy Mood, MD  Assistant: Barnett Applebaum, MD this surgery required a high level surgical assistant with none other readily available  Preoperative Antibiotics: 3g Ancef  Estimated Blood Loss: 50 mL  IV Fluids: 661mL  Urine Output:: 168mL  Drains or Tubes: none  Implants: none  Specimens Removed: Uterine leiomyoma and endometrial curettings.  Complications: none  Intraoperative Findings:  4cm pedunculated prolapsing submucosal leiomyoma.  Following removal cervix noted to be hemostatic.  The cervix was dilated to about 3cm precluding placement of V-care and deliniation of the vaginal fornix.  In addition risk of ureteral injury in the setting of known uterine fibroids and cervical dilation deemed to great.  Will allow cervix to return to normal and plan on interval hysterectomy.  Patient Condition: stable  Procedure in Detail:  Patient was taken to the operating room were she was administered general endotracheal anesthesia.  She was positioned in the dorsal lithotomy position utilizing Allen stirups, prepped and draped in the usual sterile fashion.  Uterus was noted to be enlarged, irregular contour, and 15 week  in size.  The cervix was not well appreciated secondary to what felt like an anterior lower uterine segment fibroid. Prior to proceeding with the case a time out was performed.    Attention was turned to the patient's  pelvis.  An indwelling foley catheter was used to empty the patient's bladder.  An operative speculum was placed to allow visualization of the cervix.  Dr. Kenton Kingfisher also examined the patient and concurred with the assessment of prolapsing submucosal fibroid. At which time a prolapsing uterine leiomyoma was noted.  The leiomyoma was able to be grasped with a double tooth tenaculum and twisted of its stalk.  The fibroid appeared partially necrotic.  Marland Kitchen  Sharp curettage was performed and the resulting specimen collected and sent to pathology.    Cervix were noted to be  Hemostatic before removing the operative speculum.  Sponge and instrument counts were corrects times two.  The patient tolerated the procedure well and was taken to the recovery room in stable condition.

## 2020-09-16 NOTE — Interval H&P Note (Signed)
History and Physical Interval Note:  09/16/2020 7:21 AM  Kathryn George  has presented today for surgery, with the diagnosis of AUB.  The various methods of treatment have been discussed with the patient and family. After consideration of risks, benefits and other options for treatment, the patient has consented to  Procedure(s): TOTAL LAPAROSCOPIC HYSTERECTOMY WITH SALPINGECTOMY (Bilateral) as a surgical intervention.  The patient's history has been reviewed, patient examined, no change in status, stable for surgery.  I have reviewed the patient's chart and labs.  Questions were answered to the patient's satisfaction.     Malachy Mood

## 2020-09-16 NOTE — Anesthesia Procedure Notes (Signed)
Procedure Name: Intubation Performed by: Fletcher-Harrison, Lakeem Rozo, CRNA Pre-anesthesia Checklist: Patient identified, Emergency Drugs available, Suction available and Patient being monitored Patient Re-evaluated:Patient Re-evaluated prior to induction Oxygen Delivery Method: Circle system utilized Preoxygenation: Pre-oxygenation with 100% oxygen Induction Type: IV induction Ventilation: Mask ventilation without difficulty Laryngoscope Size: McGraph and 3 Grade View: Grade I Tube type: Oral Tube size: 7.0 mm Number of attempts: 1 Airway Equipment and Method: Stylet and Oral airway Placement Confirmation: ETT inserted through vocal cords under direct vision,  positive ETCO2,  breath sounds checked- equal and bilateral and CO2 detector Secured at: 21 cm Tube secured with: Tape Dental Injury: Teeth and Oropharynx as per pre-operative assessment        

## 2020-09-17 ENCOUNTER — Telehealth: Payer: Self-pay

## 2020-09-17 ENCOUNTER — Encounter: Payer: Self-pay | Admitting: Obstetrics and Gynecology

## 2020-09-17 LAB — SURGICAL PATHOLOGY

## 2020-09-17 NOTE — Telephone Encounter (Signed)
Transition Care Management Unsuccessful Follow-up Telephone Call  Date of discharge and from where:  09/16/2020 from [facility]  Attempts:  1st Attempt  Reason for unsuccessful TCM follow-up call:  Left voice message

## 2020-09-17 NOTE — Telephone Encounter (Signed)
Transition Care Management Follow-up Telephone Call  Date of discharge and from where: 09/16/2020 from [facility]  How have you been since you were released from the hospital? Pt states that she is feeling well today and doesn't have any concerns  Any questions or concerns? No  Items Reviewed:  Did the pt receive and understand the discharge instructions provided? Yes   Medications obtained and verified? Yes   Other? No   Any new allergies since your discharge? No   Dietary orders reviewed? n/a  Do you have support at home? Yes   Functional Questionnaire: (I = Independent and D = Dependent) ADLs: I  Bathing/Dressing- I  Meal Prep- I  Eating- I  Maintaining continence- I  Transferring/Ambulation- I  Managing Meds- I   Follow up appointments reviewed:   PCP Hospital f/u appt confirmed? Yes  Scheduled to see Halina Maidens, MD on 10/16/2020 @ 10:20am.  [facility] f/u appt confirmed? Yes  Scheduled to see Malachy Mood, MD on 09/26/2020 @ 08:30am.  Are transportation arrangements needed? No   If their condition worsens, is the pt aware to call PCP or go to the Emergency Dept.? Yes  Was the patient provided with contact information for the PCP's office or ED? Yes  Was to pt encouraged to call back with questions or concerns? Yes

## 2020-09-22 ENCOUNTER — Telehealth: Payer: Self-pay

## 2020-09-22 NOTE — Telephone Encounter (Signed)
Pt called after hour nurse 09/19/20 8:20PM C/O had surgery to remove fibroids from the uterus; was supposed to have hyst but wasn't able to be done; no incisions; states today has been feeling dizzy and having H/As; has been taking hydrocodone. [phone]  Pt states she is feeling better today; has stopped taking pain medication and states she doesn't need it.  Adv will let AMS know.

## 2020-09-26 ENCOUNTER — Encounter: Payer: Self-pay | Admitting: Obstetrics and Gynecology

## 2020-09-26 ENCOUNTER — Other Ambulatory Visit: Payer: Self-pay

## 2020-09-26 ENCOUNTER — Ambulatory Visit (INDEPENDENT_AMBULATORY_CARE_PROVIDER_SITE_OTHER): Payer: BC Managed Care – PPO | Admitting: Obstetrics and Gynecology

## 2020-09-26 VITALS — BP 122/76 | Wt 251.0 lb

## 2020-09-26 DIAGNOSIS — D251 Intramural leiomyoma of uterus: Secondary | ICD-10-CM

## 2020-09-26 DIAGNOSIS — Z4889 Encounter for other specified surgical aftercare: Secondary | ICD-10-CM

## 2020-09-26 DIAGNOSIS — D252 Subserosal leiomyoma of uterus: Secondary | ICD-10-CM

## 2020-09-26 DIAGNOSIS — N939 Abnormal uterine and vaginal bleeding, unspecified: Secondary | ICD-10-CM

## 2020-09-26 NOTE — Progress Notes (Signed)
Postoperative Follow-up Patient presents post op from vaginal myomectomy 1weeks ago for abnormal uterine bleeding and uterine fibroids.  Subjective: Patient reports marked improvement in her preop symptoms. Eating a regular diet without difficulty. The patient is not having any pain.  Activity: normal activities of daily living.  Has stopped bleeding and cramping following removal of the prolapsed polyp/fibroid.  Objective: Blood pressure 122/76, weight 251 lb (113.9 kg), last menstrual period 09/07/2020.  General: NAD Pulmonary: no increased work of breathing Extremities: no edema Neurologic: normal gait    Admission on 09/16/2020, Discharged on 09/16/2020  Component Date Value Ref Range Status  . ABO/RH(D) 09/16/2020    Final                   Value:A POS Performed at [facility], [address] [place], [place] [address]   . Preg Test, Ur 09/16/2020 NEGATIVE  NEGATIVE Final   Comment:        THE SENSITIVITY OF THIS METHODOLOGY IS >24 mIU/mL   . SURGICAL PATHOLOGY 09/16/2020    Final-Edited                   Value:SURGICAL PATHOLOGY CASE: [id number] PATIENT: [facility] Surgical Pathology Report     Specimen Submitted: A. Endometrial curettings, uterine fibroid  Clinical History: AUB    DIAGNOSIS: A. ENDOMETRIUM; CURETTAGE: - FRAGMENTS OF BENIGN ENDOMETRIAL POLYP WITH BACKGROUND WEAKLY PROLIFERATIVE ENDOMETRIUM AND FOCAL STROMAL BREAKDOWN. - PORTIONS OF BENIGN SMOOTH MUSCLE WITH ASSOCIATED CYSTICALLY DILATED ENDOMETRIAL GLANDS. - SUPERFICIAL FRAGMENTS OF BENIGN ENDOCERVICAL AND ECTOCERVICAL TISSUE. - NEGATIVE FOR ATYPICAL HYPERPLASIA/EIN, DYSPLASIA, AND MALIGNANCY.  Comment: The clinical impression of a submucosal leiomyoma is noted. The largest tissue fragment displays myometrial smooth muscle, with associated endometrial glands, some showing cystic dilatation, with associated hemorrhage and hemosiderin laden macrophages. The  findings are most suggestive of a benign adenomyoma. Clinical correlation is recommended.  GROSS DESCRIPTION: A. Labeled: Endometrial                          curettings, uterine fibroid Received: Formalin Collection time: 8:05 AM on 09/16/2020 Placed into formalin time: 8:21 AM on 09/16/2020 Tissue fragment(s): Multiple Size: Aggregate, 7.2 x 6.5 x 3.7 cm Description: Received are fragments of tan-pink soft tissue, blood clot, and tan-red membranous tissue.  The largest soft tissue fragment, 6.4 x 4.4 x 3.7 cm, is serially sectioned revealing a pink and white focally whorled and softened cut surface.  There are central areas of hemorrhage and multiple hemorrhagic cysts ranging from 1 to 1.7 cm in greatest dimension. The specimen is submitted in cassettes 1-5 as follows: 1 - smallest tan soft tissue fragments, submitted entirely 2 - representative sections of membranous tissue 3 - 5 - representative sections of the largest fragment with areas of hemorrhage and cysts (1/cm)  RB 09/16/2020.  Final Diagnosis performed by Allena Napoleon, MD.   Electronically signed 09/17/2020 12:13:02PM The electronic signature indicates that the                          named Attending Pathologist has evaluated the specimen Technical component performed at [facility], [address] [place], [place] [address] Lab: [phone] Dir: Rush Farmer, MD, MMM  Professional component performed at [facility], [facility], [place], [place], [place] [address] Lab: [phone] Dir: Dellia Nims. Reuel Derby, MD     Assessment: 45 y.o. s/p vaginal myomectomy  stable  Plan: Patient has done well after surgery with no apparent complications.  I have discussed the post-operative course to date, and the expected progress moving forward.  The patient understands what complications to be concerned about.  I will see the patient in routine follow up, or sooner if needed.    Activity plan: No  restriction.  Is interested in proceed with [place].     Malachy Mood, MD, [facility], [facility] 09/26/2020, 8:57 AM

## 2020-10-16 ENCOUNTER — Encounter: Payer: BC Managed Care – PPO | Admitting: Internal Medicine

## 2020-10-22 NOTE — Telephone Encounter (Signed)
Pt has been seen since

## 2020-10-31 ENCOUNTER — Ambulatory Visit (INDEPENDENT_AMBULATORY_CARE_PROVIDER_SITE_OTHER): Payer: BC Managed Care – PPO | Admitting: Obstetrics and Gynecology

## 2020-10-31 ENCOUNTER — Encounter: Payer: Self-pay | Admitting: Obstetrics and Gynecology

## 2020-10-31 ENCOUNTER — Other Ambulatory Visit: Payer: Self-pay

## 2020-10-31 VITALS — BP 120/80 | Wt 250.0 lb

## 2020-10-31 DIAGNOSIS — Z4889 Encounter for other specified surgical aftercare: Secondary | ICD-10-CM

## 2020-10-31 NOTE — Progress Notes (Signed)
Postoperative Follow-up Patient presents post op from vaginal myomectomy 6weeks ago for prolapsing submucosal fibroid.  Subjective: Patient reports marked improvement in her preop symptoms. Eating a regular diet without difficulty. The patient is not having any pain.  Activity: normal activities of daily living.  Objective: Blood pressure 120/80, weight 250 lb (113.4 kg), last menstrual period 10/23/2020.  General: NAD Pulmonary: no increased work of breathing GU: normal external female genitalia normal cervix, no CMT, uterus remains enlarged irregular shape and contour, no adnexal tenderness or masses Extremities: no edema Neurologic: normal gait    Admission on 09/16/2020, Discharged on 09/16/2020  Component Date Value Ref Range Status  . ABO/RH(D) 09/16/2020    Final                   Value:A POS Performed at [facility], [address] [place], [place] [address]   . Preg Test, Ur 09/16/2020 NEGATIVE  NEGATIVE Final   Comment:        THE SENSITIVITY OF THIS METHODOLOGY IS >24 mIU/mL   . SURGICAL PATHOLOGY 09/16/2020    Final-Edited                   Value:SURGICAL PATHOLOGY CASE: [id number] PATIENT: [facility] Surgical Pathology Report     Specimen Submitted: A. Endometrial curettings, uterine fibroid  Clinical History: AUB    DIAGNOSIS: A. ENDOMETRIUM; CURETTAGE: - FRAGMENTS OF BENIGN ENDOMETRIAL POLYP WITH BACKGROUND WEAKLY PROLIFERATIVE ENDOMETRIUM AND FOCAL STROMAL BREAKDOWN. - PORTIONS OF BENIGN SMOOTH MUSCLE WITH ASSOCIATED CYSTICALLY DILATED ENDOMETRIAL GLANDS. - SUPERFICIAL FRAGMENTS OF BENIGN ENDOCERVICAL AND ECTOCERVICAL TISSUE. - NEGATIVE FOR ATYPICAL HYPERPLASIA/EIN, DYSPLASIA, AND MALIGNANCY.  Comment: The clinical impression of a submucosal leiomyoma is noted. The largest tissue fragment displays myometrial smooth muscle, with associated endometrial glands, some showing cystic dilatation, with associated hemorrhage  and hemosiderin laden macrophages. The findings are most suggestive of a benign adenomyoma. Clinical correlation is recommended.  GROSS DESCRIPTION: A. Labeled: Endometrial                          curettings, uterine fibroid Received: Formalin Collection time: 8:05 AM on 09/16/2020 Placed into formalin time: 8:21 AM on 09/16/2020 Tissue fragment(s): Multiple Size: Aggregate, 7.2 x 6.5 x 3.7 cm Description: Received are fragments of tan-pink soft tissue, blood clot, and tan-red membranous tissue.  The largest soft tissue fragment, 6.4 x 4.4 x 3.7 cm, is serially sectioned revealing a pink and white focally whorled and softened cut surface.  There are central areas of hemorrhage and multiple hemorrhagic cysts ranging from 1 to 1.7 cm in greatest dimension. The specimen is submitted in cassettes 1-5 as follows: 1 - smallest tan soft tissue fragments, submitted entirely 2 - representative sections of membranous tissue 3 - 5 - representative sections of the largest fragment with areas of hemorrhage and cysts (1/cm)  RB 09/16/2020.  Final Diagnosis performed by Allena Napoleon, MD.   Electronically signed 09/17/2020 12:13:02PM The electronic signature indicates that the                          named Attending Pathologist has evaluated the specimen Technical component performed at [facility], [address] [place], [place] [address] Lab: [phone] Dir: Rush Farmer, MD, MMM  Professional component performed at [facility], [facility], [place], [place], [place] [address] Lab: [phone] Dir: Dellia Nims. Reuel Derby, MD  Assessment: 45 y.o. s/p vaginal myomectomy stable  Plan: Patient has done well after surgery with no apparent complications.  I have discussed the post-operative course to date, and the expected progress moving forward.  The patient understands what complications to be concerned about.  I will see the patient in routine follow up, or sooner if needed.     Activity plan: No restriction.   Malachy Mood, MD, [facility], [facility] 10/31/2020, 9:09 AM

## 2021-01-01 ENCOUNTER — Encounter: Payer: BC Managed Care – PPO | Admitting: Internal Medicine

## 2021-01-01 NOTE — Progress Notes (Deleted)
Date:  01/01/2021   Name:  Kathryn George   DOB:  1975/09/22   MRN:  [id number]   Chief Complaint: No chief complaint on file. Kathryn George is a 45 y.o. female who presents today for her Complete Annual Exam. She feels {DESC; WELL/FAIRLY WELL/POORLY:18703}. She reports exercising ***. She reports she is sleeping {DESC; WELL/FAIRLY WELL/POORLY:18703}. Breast complaints ***.  Mammogram: 11/2019 [facility] DEXA: none Pap smear: 09/2018 Thin prep Colonoscopy: none  Immunization History  Administered Date(s) Administered   Moderna Sars-Covid-2 Vaccination 09/14/2019, 10/12/2019, 06/15/2020    HPI  Lab Results  Component Value Date   CREATININE 0.71 09/12/2020   BUN 14 09/12/2020   NA 136 09/12/2020   K 3.6 09/12/2020   CL 105 09/12/2020   CO2 23 09/12/2020   Lab Results  Component Value Date   CHOL 199 10/15/2019   HDL 55 10/15/2019   LDLCALC 123 (H) 10/15/2019   TRIG 119 10/15/2019   CHOLHDL 3.6 10/15/2019   Lab Results  Component Value Date   TSH 1.010 07/31/2020   Lab Results  Component Value Date   HGBA1C 5.7 (H) 10/02/2018   Lab Results  Component Value Date   WBC 4.2 09/12/2020   HGB 9.5 (L) 09/12/2020   HCT 30.9 (L) 09/12/2020   MCV 70.7 (L) 09/12/2020   PLT 445 (H) 09/12/2020   Lab Results  Component Value Date   ALT 12 10/15/2019   AST 13 10/15/2019   ALKPHOS 69 10/15/2019   BILITOT 0.2 10/15/2019     Review of Systems  Constitutional:  Negative for chills, fatigue and fever.  HENT:  Negative for congestion, hearing loss, tinnitus, trouble swallowing and voice change.   Eyes:  Negative for visual disturbance.  Respiratory:  Negative for cough, chest tightness, shortness of breath and wheezing.   Cardiovascular:  Negative for chest pain, palpitations and leg swelling.  Gastrointestinal:  Negative for abdominal pain, constipation, diarrhea and vomiting.  Endocrine: Negative for polydipsia and polyuria.  Genitourinary:   Negative for dysuria, frequency, genital sores, vaginal bleeding and vaginal discharge.  Musculoskeletal:  Negative for arthralgias, gait problem and joint swelling.  Skin:  Negative for color change and rash.  Neurological:  Negative for dizziness, tremors, light-headedness and headaches.  Hematological:  Negative for adenopathy. Does not bruise/bleed easily.  Psychiatric/Behavioral:  Negative for dysphoric mood and sleep disturbance. The patient is not nervous/anxious.    Patient Active Problem List   Diagnosis Date Noted   Abnormal uterine bleeding (AUB) 09/16/2020   Intramural, submucous, and subserous leiomyoma of uterus 07/31/2020   Abnormal uterine bleeding 07/31/2020   Microcytic anemia 07/31/2020   Vitamin D deficiency 10/15/2019   Migraine without aura and without status migrainosus, not intractable 10/15/2019   Sebaceous cyst of skin of left breast 08/25/2018   Hyperlipidemia, mixed 04/17/2017   Menorrhagia with regular cycle 10/04/2016   Environmental and seasonal allergies 10/04/2016    Allergies  Allergen Reactions   Shellfish Allergy Shortness Of Breath and Swelling    CRAB AND LOBSTER    Past Surgical History:  Procedure Laterality Date   DILATION AND CURETTAGE OF UTERUS  09/16/2020   Procedure: DILATATION AND CURETTAGE;  Surgeon: Malachy Mood, MD;  Location: [facility] ORS;  Service: Gynecology;;   Amado Coe  09/16/2020   Procedure: VAGINAL MYOMECTOMY;  Surgeon: Malachy Mood, MD;  Location: [facility] ORS;  Service: Gynecology;;   NO PAST SURGERIES      Social History   Tobacco Use  Smoking status: Former    Types: Cigars    Quit date: 06/21/2017    Years since quitting: 3.5   Smokeless tobacco: Never   Tobacco comments:    started cigars 2 years ago.  Vaping Use   Vaping Use: Never used  Substance Use Topics   Alcohol use: Yes    Comment: social   Drug use: No     Medication list has been reviewed and updated.  No outpatient medications have been  marked as taking for the 01/01/21 encounter (Appointment) with Glean Hess, MD.    [facility] 2/9 Scores 10/15/2019 10/02/2018 04/15/2017  PHQ - 2 Score 0 0 0  PHQ- 9 Score 0 - -    GAD 7 : Generalized Anxiety Score 10/15/2019  Nervous, Anxious, on Edge 0  Control/stop worrying 0  Worry too much - different things 0  Trouble relaxing 0  Restless 0  Easily annoyed or irritable 0  Afraid - awful might happen 0  Total GAD 7 Score 0  Anxiety Difficulty Not difficult at all    BP Readings from Last 3 Encounters:  10/31/20 120/80  09/26/20 122/76  09/16/20 129/76    Physical Exam Vitals and nursing note reviewed.  Constitutional:      General: She is not in acute distress.    Appearance: She is well-developed.  HENT:     Head: Normocephalic and atraumatic.     Right Ear: Tympanic membrane and ear canal normal.     Left Ear: Tympanic membrane and ear canal normal.     Nose:     Right Sinus: No maxillary sinus tenderness.     Left Sinus: No maxillary sinus tenderness.  Eyes:     General: No scleral icterus.       Right eye: No discharge.        Left eye: No discharge.     Conjunctiva/sclera: Conjunctivae normal.  Neck:     Thyroid: No thyromegaly.     Vascular: No carotid bruit.  Cardiovascular:     Rate and Rhythm: Normal rate and regular rhythm.     Pulses: Normal pulses.     Heart sounds: Normal heart sounds.  Pulmonary:     Effort: Pulmonary effort is normal. No respiratory distress.     Breath sounds: No wheezing.  Chest:  Breasts:    Right: No mass, nipple discharge, skin change or tenderness.     Left: No mass, nipple discharge, skin change or tenderness.  Abdominal:     General: Bowel sounds are normal.     Palpations: Abdomen is soft.     Tenderness: There is no abdominal tenderness.  Musculoskeletal:     Cervical back: Normal range of motion. No erythema.     Right lower leg: No edema.     Left lower leg: No edema.  Lymphadenopathy:     Cervical: No  cervical adenopathy.  Skin:    General: Skin is warm and dry.     Capillary Refill: Capillary refill takes less than 2 seconds.     Findings: No rash.  Neurological:     Mental Status: She is alert and oriented to person, place, and time.     Cranial Nerves: No cranial nerve deficit.     Sensory: No sensory deficit.     Deep Tendon Reflexes: Reflexes are normal and symmetric.  Psychiatric:        Attention and Perception: Attention normal.        Mood and  Affect: Mood normal.    Wt Readings from Last 3 Encounters:  10/31/20 250 lb (113.4 kg)  09/26/20 251 lb (113.9 kg)  09/16/20 253 lb (114.8 kg)    There were no vitals taken for this visit.  Assessment and Plan:

## 2021-01-14 ENCOUNTER — Other Ambulatory Visit: Payer: Self-pay

## 2021-01-14 ENCOUNTER — Encounter: Payer: Self-pay | Admitting: Internal Medicine

## 2021-01-14 ENCOUNTER — Ambulatory Visit (INDEPENDENT_AMBULATORY_CARE_PROVIDER_SITE_OTHER): Payer: BC Managed Care – PPO | Admitting: Internal Medicine

## 2021-01-14 VITALS — BP 124/82 | HR 64 | Temp 98.0°F | Ht 63.0 in | Wt 248.0 lb

## 2021-01-14 DIAGNOSIS — Z1159 Encounter for screening for other viral diseases: Secondary | ICD-10-CM

## 2021-01-14 DIAGNOSIS — Z0000 Encounter for general adult medical examination without abnormal findings: Secondary | ICD-10-CM | POA: Diagnosis not present

## 2021-01-14 DIAGNOSIS — G43009 Migraine without aura, not intractable, without status migrainosus: Secondary | ICD-10-CM

## 2021-01-14 DIAGNOSIS — Z1231 Encounter for screening mammogram for malignant neoplasm of breast: Secondary | ICD-10-CM | POA: Diagnosis not present

## 2021-01-14 MED ORDER — IBUPROFEN 800 MG PO TABS: 800.0000 mg | ORAL_TABLET | ORAL | 0 refills | Status: DC | PRN

## 2021-01-14 NOTE — Progress Notes (Signed)
Date:  01/14/2021   Name:  Kathryn George   DOB:  07-05-1975   MRN:  [id number]   Chief Complaint: Annual Exam (Breast exam no pap ) Kathryn George is a 45 y.o. female who presents today for her Complete Annual Exam. She feels well. She reports exercising walking 7 days a week 9000+ steps. She reports she is sleeping well. Breast complaints none.  Mammogram: 11/2019  [facility] DEXA: none Pap smear: 09/2018 thin prep Colonoscopy: none  Immunization History  Administered Date(s) Administered   Moderna Sars-Covid-2 Vaccination 09/14/2019, 10/12/2019, 06/15/2020    HPI  Lab Results  Component Value Date   CREATININE 0.71 09/12/2020   BUN 14 09/12/2020   NA 136 09/12/2020   K 3.6 09/12/2020   CL 105 09/12/2020   CO2 23 09/12/2020   Lab Results  Component Value Date   CHOL 199 10/15/2019   HDL 55 10/15/2019   LDLCALC 123 (H) 10/15/2019   TRIG 119 10/15/2019   CHOLHDL 3.6 10/15/2019   Lab Results  Component Value Date   TSH 1.010 07/31/2020   Lab Results  Component Value Date   HGBA1C 5.7 (H) 10/02/2018   Lab Results  Component Value Date   WBC 4.2 09/12/2020   HGB 9.5 (L) 09/12/2020   HCT 30.9 (L) 09/12/2020   MCV 70.7 (L) 09/12/2020   PLT 445 (H) 09/12/2020   Lab Results  Component Value Date   ALT 12 10/15/2019   AST 13 10/15/2019   ALKPHOS 69 10/15/2019   BILITOT 0.2 10/15/2019     Review of Systems  Constitutional:  Negative for chills, fatigue and fever.  HENT:  Negative for congestion, hearing loss, tinnitus, trouble swallowing and voice change.   Eyes:  Negative for visual disturbance.  Respiratory:  Negative for cough, chest tightness, shortness of breath and wheezing.   Cardiovascular:  Negative for chest pain, palpitations and leg swelling.  Gastrointestinal:  Negative for abdominal pain, constipation, diarrhea and vomiting.       Intermittent reflux  Endocrine: Negative for polydipsia and polyuria.  Genitourinary:   Negative for dysuria, frequency, genital sores, menstrual problem, vaginal bleeding and vaginal discharge.  Musculoskeletal:  Negative for arthralgias, gait problem and joint swelling.  Skin:  Negative for color change and rash.  Allergic/Immunologic: Negative for environmental allergies.  Neurological:  Positive for headaches. Negative for dizziness, tremors and light-headedness.  Hematological:  Negative for adenopathy. Does not bruise/bleed easily.  Psychiatric/Behavioral:  Positive for sleep disturbance (adjusting to third shift). Negative for dysphoric mood. The patient is not nervous/anxious.    Patient Active Problem List   Diagnosis Date Noted   Abnormal uterine bleeding (AUB) 09/16/2020   Intramural, submucous, and subserous leiomyoma of uterus 07/31/2020   Abnormal uterine bleeding 07/31/2020   Microcytic anemia 07/31/2020   Vitamin D deficiency 10/15/2019   Migraine without aura and without status migrainosus, not intractable 10/15/2019   Sebaceous cyst of skin of left breast 08/25/2018   Hyperlipidemia, mixed 04/17/2017   Menorrhagia with regular cycle 10/04/2016   Environmental and seasonal allergies 10/04/2016    Allergies  Allergen Reactions   Shellfish Allergy Shortness Of Breath and Swelling    CRAB AND LOBSTER    Past Surgical History:  Procedure Laterality Date   DILATION AND CURETTAGE OF UTERUS  09/16/2020   Procedure: DILATATION AND CURETTAGE;  Surgeon: Malachy Mood, MD;  Location: [facility] ORS;  Service: Gynecology;;   Amado Coe  09/16/2020   Procedure: VAGINAL MYOMECTOMY;  Surgeon:  Malachy Mood, MD;  Location: [facility] ORS;  Service: Gynecology;;   NO PAST SURGERIES      Social History   Tobacco Use   Smoking status: Former    Passive exposure: Past   Smokeless tobacco: Never  Vaping Use   Vaping Use: Never used  Substance Use Topics   Alcohol use: Yes    Comment: social   Drug use: No     Medication list has been reviewed and  updated.  Current Meds  Medication Sig   ibuprofen (ADVIL) 800 MG tablet Take 800 mg by mouth as needed.   naproxen sodium (ALEVE) 220 MG tablet Take 220 mg by mouth as needed.   [DISCONTINUED] HYDROmorphone (DILAUDID) 2 MG tablet Take by mouth.   [DISCONTINUED] meloxicam (MOBIC) 15 MG tablet Take 1 tablet (15 mg total) by mouth daily.    PHQ 2/9 Scores 10/15/2019 10/02/2018 04/15/2017  PHQ - 2 Score 0 0 0  PHQ- 9 Score 0 - -    GAD 7 : Generalized Anxiety Score 10/15/2019  Nervous, Anxious, on Edge 0  Control/stop worrying 0  Worry too much - different things 0  Trouble relaxing 0  Restless 0  Easily annoyed or irritable 0  Afraid - awful might happen 0  Total GAD 7 Score 0  Anxiety Difficulty Not difficult at all    BP Readings from Last 3 Encounters:  01/14/21 124/82  10/31/20 120/80  09/26/20 122/76    Physical Exam Vitals and nursing note reviewed.  Constitutional:      General: She is not in acute distress.    Appearance: She is well-developed.  HENT:     Head: Normocephalic and atraumatic.     Right Ear: Tympanic membrane and ear canal normal.     Left Ear: Tympanic membrane and ear canal normal.     Nose:     Right Sinus: No maxillary sinus tenderness.     Left Sinus: No maxillary sinus tenderness.  Eyes:     General: No scleral icterus.       Right eye: No discharge.        Left eye: No discharge.     Conjunctiva/sclera: Conjunctivae normal.  Neck:     Thyroid: No thyromegaly.     Vascular: No carotid bruit.  Cardiovascular:     Rate and Rhythm: Normal rate and regular rhythm.     Pulses: Normal pulses.     Heart sounds: Normal heart sounds.  Pulmonary:     Effort: Pulmonary effort is normal. No respiratory distress.     Breath sounds: No wheezing.  Chest:  Breasts:    Right: No mass, nipple discharge, skin change or tenderness.     Left: No mass, nipple discharge, skin change or tenderness.  Abdominal:     General: Bowel sounds are normal.      Palpations: Abdomen is soft.     Tenderness: There is no abdominal tenderness.  Musculoskeletal:     Cervical back: Normal range of motion. No erythema.     Right lower leg: No edema.     Left lower leg: No edema.  Lymphadenopathy:     Cervical: No cervical adenopathy.  Skin:    General: Skin is warm and dry.     Findings: No rash.  Neurological:     Mental Status: She is alert and oriented to person, place, and time.     Cranial Nerves: No cranial nerve deficit.     Sensory: No sensory deficit.  Deep Tendon Reflexes: Reflexes are normal and symmetric.  Psychiatric:        Attention and Perception: Attention normal.        Mood and Affect: Mood normal.    Wt Readings from Last 3 Encounters:  01/14/21 248 lb (112.5 kg)  10/31/20 250 lb (113.4 kg)  09/26/20 251 lb (113.9 kg)    BP 124/82   Pulse 64   Temp 98 F (36.7 C) (Oral)   Ht '5\' 3"'$  (1.6 m)   Wt 248 lb (112.5 kg)   LMP 01/11/2021 (Exact Date)   SpO2 97%   BMI 43.93 kg/m   Assessment and Plan: 1. Annual physical exam Exam is normal except for weight. Encourage regular exercise and appropriate dietary changes. - CBC with Differential/Platelet - Comprehensive metabolic panel - Lipid panel - TSH - Hemoglobin A1c  2. Encounter for screening mammogram for breast cancer To be scheduled at [facility]  3. Need for hepatitis C screening test - Hepatitis C antibody  4. Migraine without aura and without status migrainosus, not intractable Clinically stable without change in character or frequency of migraine headaches Headaches respond well to current therapy with ibuprofen PRN. Will continue current plan, follow up if worsening. - ibuprofen (ADVIL) 800 MG tablet; Take 1 tablet (800 mg total) by mouth as needed.  Dispense: 30 tablet; Refill: 0   Partially dictated using Editor, commissioning. Any errors are unintentional.  Halina Maidens, MD [facility]  01/14/2021

## 2021-01-14 NOTE — Patient Instructions (Signed)
Call insurance to see if they cover a Screening Colonoscopy at your age.

## 2021-01-15 LAB — CBC WITH DIFFERENTIAL/PLATELET
Basophils Absolute: 0.1 10*3/uL (ref 0.0–0.2)
Basos: 1 %
EOS (ABSOLUTE): 0.1 10*3/uL (ref 0.0–0.4)
Eos: 2 %
Hematocrit: 33.7 % — ABNORMAL LOW (ref 34.0–46.6)
Hemoglobin: 9.9 g/dL — ABNORMAL LOW (ref 11.1–15.9)
Immature Grans (Abs): 0 10*3/uL (ref 0.0–0.1)
Immature Granulocytes: 0 %
Lymphocytes Absolute: 1.5 10*3/uL (ref 0.7–3.1)
Lymphs: 19 %
MCH: 20.3 pg — ABNORMAL LOW (ref 26.6–33.0)
MCHC: 29.4 g/dL — ABNORMAL LOW (ref 31.5–35.7)
MCV: 69 fL — ABNORMAL LOW (ref 79–97)
Monocytes Absolute: 0.8 10*3/uL (ref 0.1–0.9)
Monocytes: 10 %
Neutrophils Absolute: 5.4 10*3/uL (ref 1.4–7.0)
Neutrophils: 68 %
Platelets: 412 10*3/uL (ref 150–450)
RBC: 4.87 x10E6/uL (ref 3.77–5.28)
RDW: 19.1 % — ABNORMAL HIGH (ref 11.7–15.4)
WBC: 8 10*3/uL (ref 3.4–10.8)

## 2021-01-15 LAB — LIPID PANEL
Chol/HDL Ratio: 3.3 ratio (ref 0.0–4.4)
Cholesterol, Total: 187 mg/dL (ref 100–199)
HDL: 57 mg/dL (ref >39)
LDL Chol Calc (NIH): 109 mg/dL — ABNORMAL HIGH (ref 0–99)
Triglycerides: 121 mg/dL (ref 0–149)
VLDL Cholesterol Cal: 21 mg/dL (ref 5–40)

## 2021-01-15 LAB — COMPREHENSIVE METABOLIC PANEL
ALT: 9 IU/L (ref 0–32)
AST: 8 IU/L (ref 0–40)
Albumin/Globulin Ratio: 1.1 — ABNORMAL LOW (ref 1.2–2.2)
Albumin: 3.7 g/dL — ABNORMAL LOW (ref 3.8–4.8)
Alkaline Phosphatase: 69 IU/L (ref 44–121)
BUN/Creatinine Ratio: 9 (ref 9–23)
BUN: 7 mg/dL (ref 6–24)
Bilirubin Total: 0.2 mg/dL (ref 0.0–1.2)
CO2: 21 mmol/L (ref 20–29)
Calcium: 9.1 mg/dL (ref 8.7–10.2)
Chloride: 102 mmol/L (ref 96–106)
Creatinine, Ser: 0.74 mg/dL (ref 0.57–1.00)
Globulin, Total: 3.5 g/dL (ref 1.5–4.5)
Glucose: 86 mg/dL (ref 65–99)
Potassium: 3.7 mmol/L (ref 3.5–5.2)
Sodium: 138 mmol/L (ref 134–144)
Total Protein: 7.2 g/dL (ref 6.0–8.5)
eGFR: 100 mL/min/{1.73_m2} (ref >59)

## 2021-01-15 LAB — TSH: TSH: 0.704 u[IU]/mL (ref 0.450–4.500)

## 2021-01-15 LAB — HEMOGLOBIN A1C
Est. average glucose Bld gHb Est-mCnc: 123 mg/dL
Hgb A1c MFr Bld: 5.9 % — ABNORMAL HIGH (ref 4.8–5.6)

## 2021-01-15 LAB — HEPATITIS C ANTIBODY: Hep C Virus Ab: <0.1 s/co ratio (ref 0.0–0.9)

## 2021-01-15 NOTE — Progress Notes (Signed)
Iron studies added R79.89.  KP

## 2021-01-20 LAB — SPECIMEN STATUS REPORT

## 2021-01-20 LAB — IRON AND TIBC
Iron Saturation: 6 % — CL (ref 15–55)
Iron: 22 ug/dL — ABNORMAL LOW (ref 27–159)
Total Iron Binding Capacity: 388 ug/dL (ref 250–450)
UIBC: 366 ug/dL (ref 131–425)

## 2021-01-20 LAB — FERRITIN: Ferritin: 21 ng/mL (ref 15–150)

## 2021-02-18 ENCOUNTER — Other Ambulatory Visit: Payer: Self-pay

## 2021-03-10 ENCOUNTER — Other Ambulatory Visit: Payer: BC Managed Care – PPO

## 2021-03-10 ENCOUNTER — Other Ambulatory Visit: Payer: Self-pay | Admitting: Internal Medicine

## 2021-03-11 ENCOUNTER — Other Ambulatory Visit: Payer: Self-pay

## 2021-03-11 DIAGNOSIS — D649 Anemia, unspecified: Secondary | ICD-10-CM

## 2021-03-12 LAB — CBC WITH DIFFERENTIAL/PLATELET
Basophils Absolute: 0 10*3/uL (ref 0.0–0.2)
Basos: 1 %
EOS (ABSOLUTE): 0.2 10*3/uL (ref 0.0–0.4)
Eos: 3 %
Hematocrit: 36.1 % (ref 34.0–46.6)
Hemoglobin: 10.8 g/dL — ABNORMAL LOW (ref 11.1–15.9)
Immature Grans (Abs): 0 10*3/uL (ref 0.0–0.1)
Immature Granulocytes: 0 %
Lymphocytes Absolute: 1.9 10*3/uL (ref 0.7–3.1)
Lymphs: 36 %
MCH: 20.8 pg — ABNORMAL LOW (ref 26.6–33.0)
MCHC: 29.9 g/dL — ABNORMAL LOW (ref 31.5–35.7)
MCV: 70 fL — ABNORMAL LOW (ref 79–97)
Monocytes Absolute: 0.6 10*3/uL (ref 0.1–0.9)
Monocytes: 12 %
Neutrophils Absolute: 2.5 10*3/uL (ref 1.4–7.0)
Neutrophils: 48 %
Platelets: 336 10*3/uL (ref 150–450)
RBC: 5.18 x10E6/uL (ref 3.77–5.28)
RDW: 20.9 % — ABNORMAL HIGH (ref 11.7–15.4)
WBC: 5.2 10*3/uL (ref 3.4–10.8)

## 2021-03-12 LAB — IRON,TIBC AND FERRITIN PANEL
Ferritin: 26 ng/mL (ref 15–150)
Iron Saturation: 10 % — ABNORMAL LOW (ref 15–55)
Iron: 37 ug/dL (ref 27–159)
Total Iron Binding Capacity: 375 ug/dL (ref 250–450)
UIBC: 338 ug/dL (ref 131–425)

## 2021-11-10 ENCOUNTER — Encounter: Payer: Self-pay | Admitting: Internal Medicine

## 2021-11-14 IMAGING — US US PELVIS COMPLETE WITH TRANSVAGINAL
1 series · 13 of 25 positions shown · non-contrast
Comparison: None

CLINICAL DATA: 44-year-old female with heavy uterine bleeding for 4
months. LMP 07/14/2020.

EXAM:
TRANSABDOMINAL AND TRANSVAGINAL ULTRASOUND OF PELVIS
TECHNIQUE: Both transabdominal and transvaginal ultrasound examinations of the
pelvis were performed. Transabdominal technique was performed for
global imaging of the pelvis including uterus, ovaries, adnexal
regions, and pelvic cul-de-sac. It was necessary to proceed with
endovaginal exam following the transabdominal exam to visualize the
endometrium and adnexa.

[Series 1: us pelvic complete with transvaginal · 85 acquisitions, 13 frames shown]
[im 1/85]
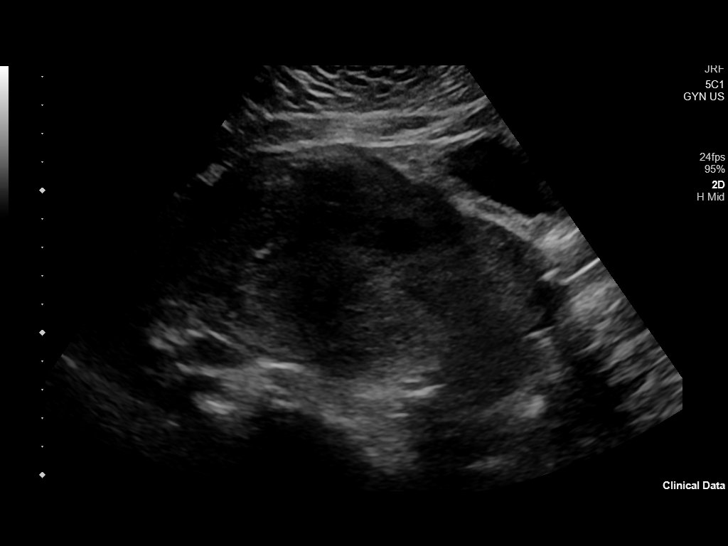
[im 8/85]
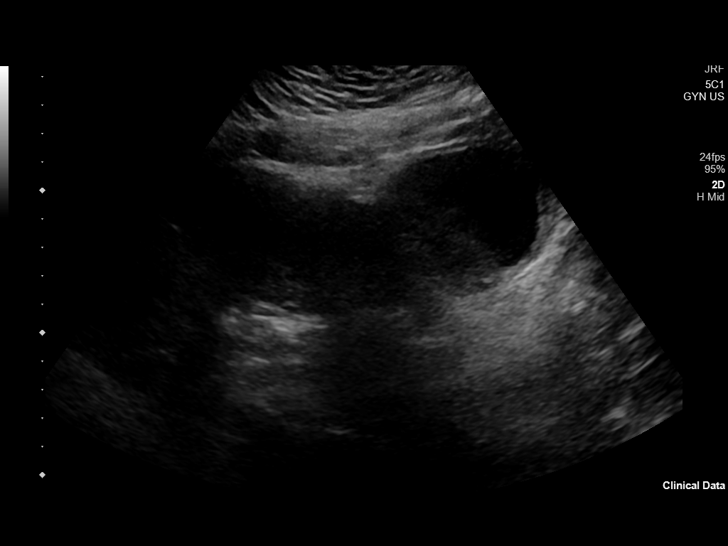
[im 15/85]
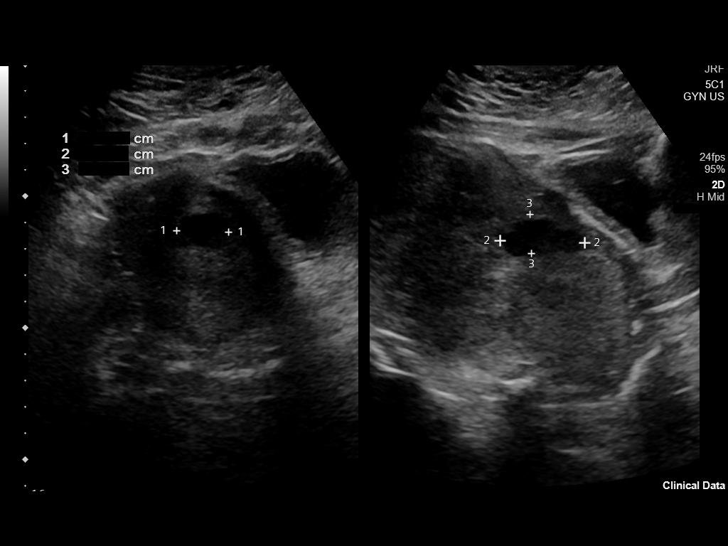
[im 22/85]
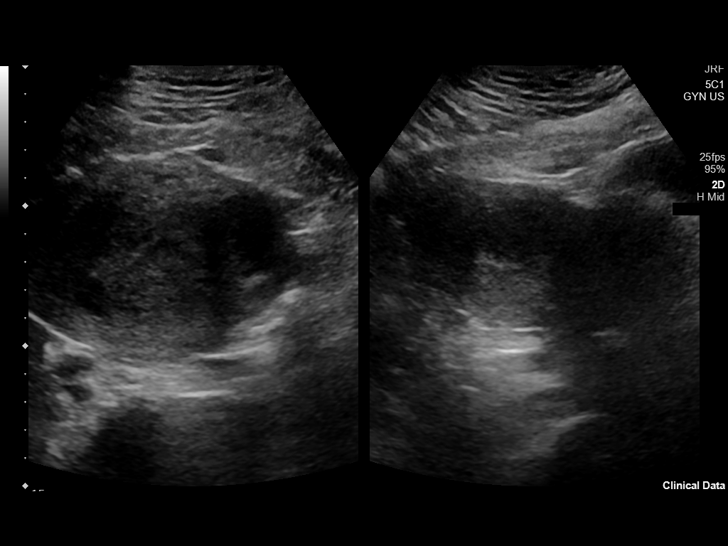
[im 29/85]
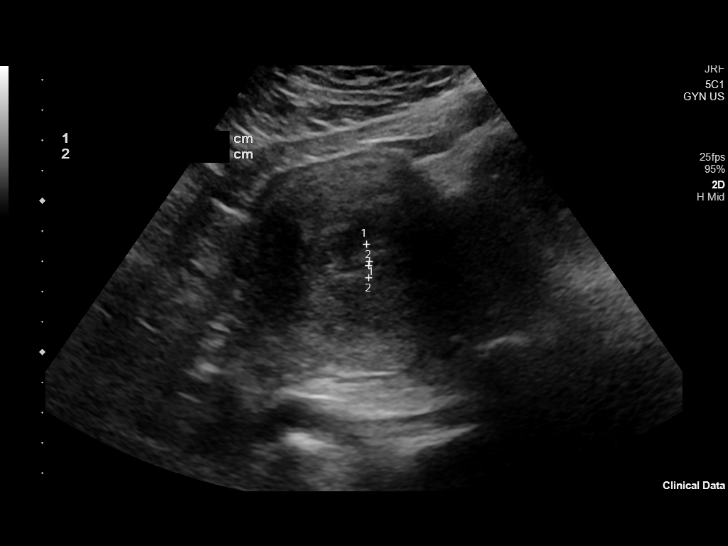
[im 36/85]
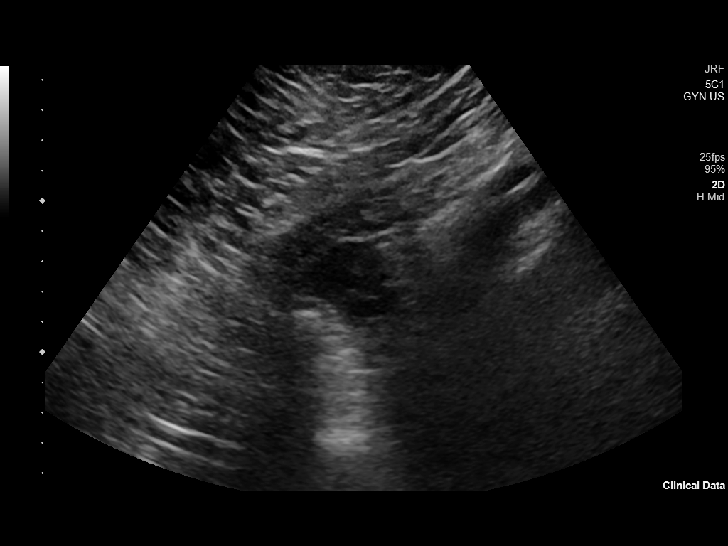
[im 43/85]
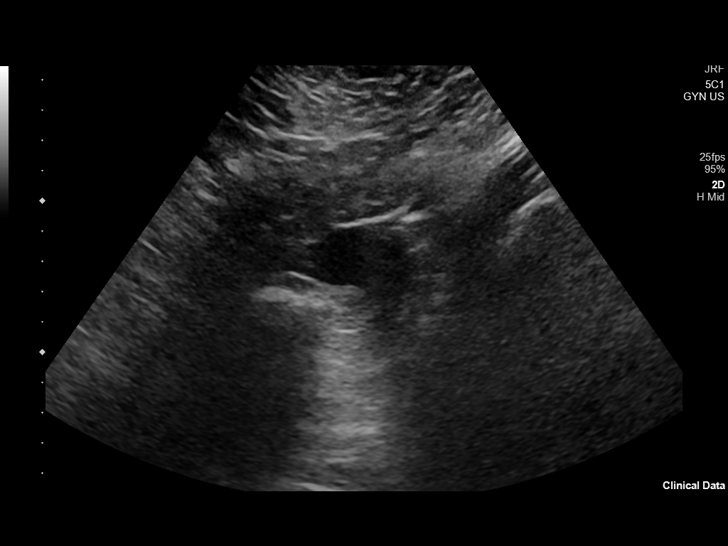
[im 50/85]
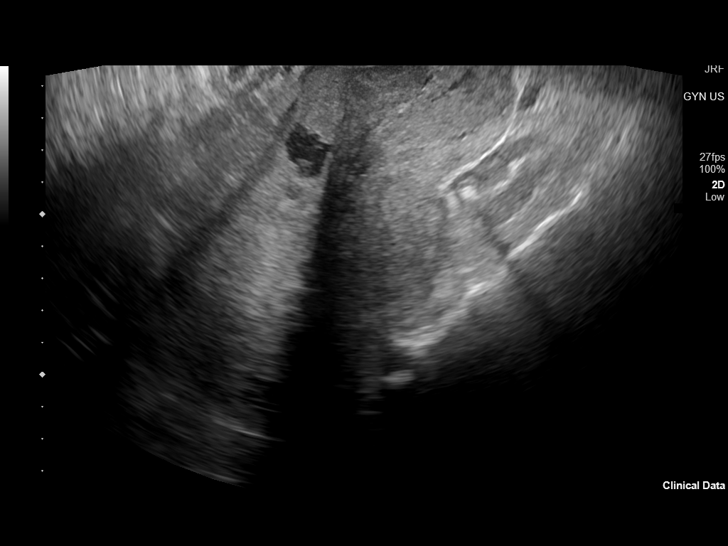
[im 57/85]
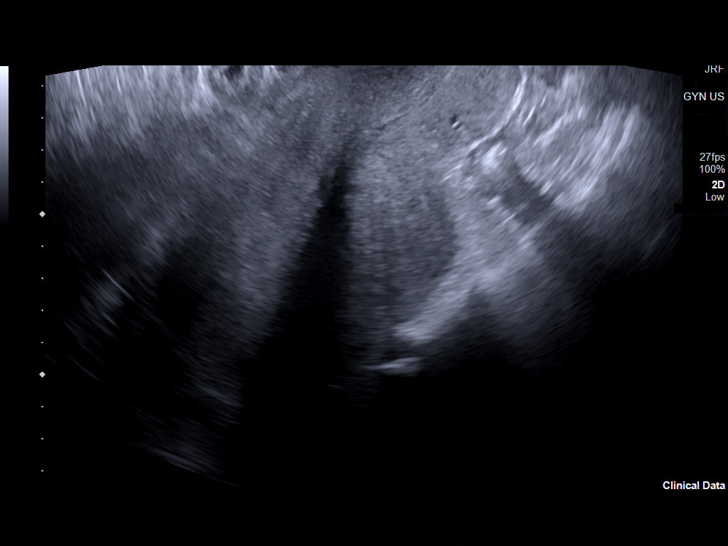
[im 64/85]
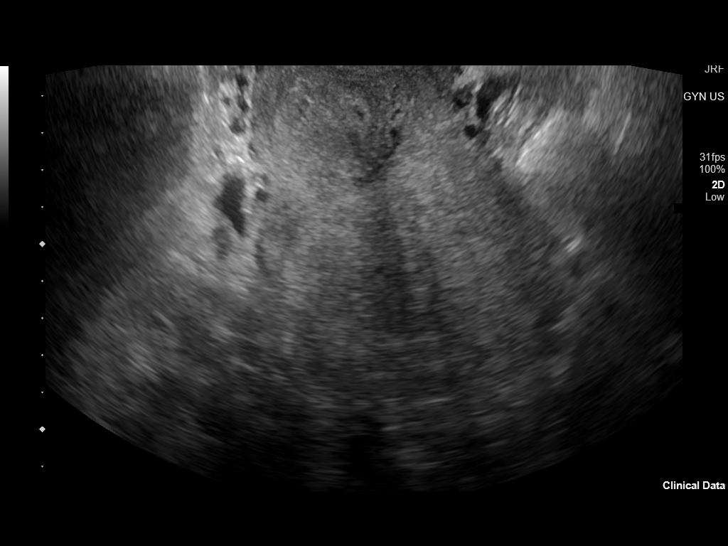
[im 71/85]
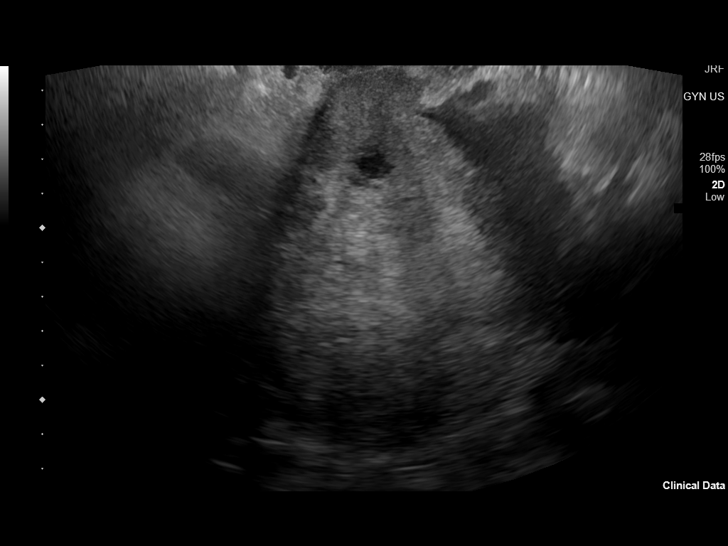
[im 78/85]
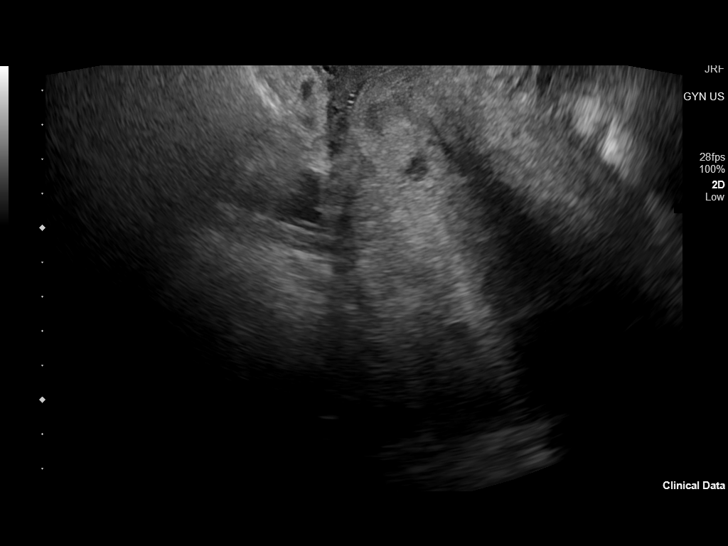
[im 85/85]
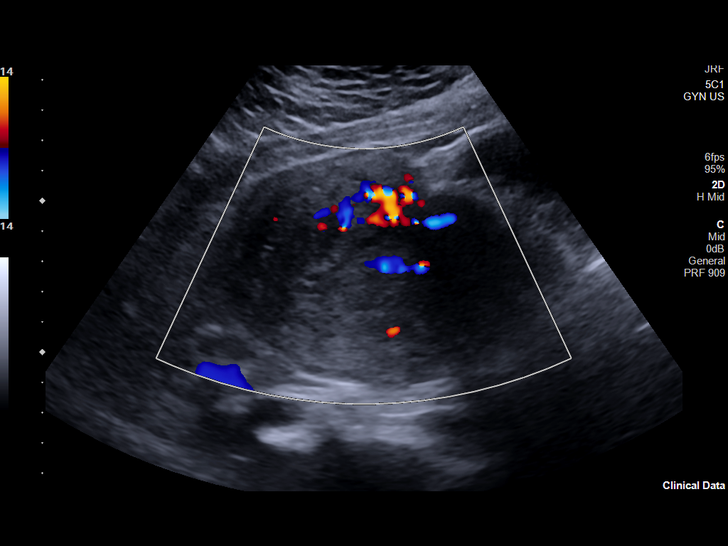

[13 of 25 positions shown; findings below may reference images not displayed]

FINDINGS: Uterus

Measurements: 14.5 x 9.4 x 9.5 cm = volume: 673 mL. Bulky myomatous
uterus, poorly visualized due to patient habitus on the
transabdominal sequences and due to slightly retroverted uterine
position on the transvaginal sequences, with representative fibroids
as follows (transabdominal measurements):

-anterior lower uterine segment intramural 3.2 x 2.0 x 1.5 cm
fibroid

-anterior right uterine body intramural 3.9 x 2.7 x 2.2 cm fibroid

-left anterior uterine body intramural 3.6 x 2.6 x 2.3 cm fibroid

-fundal intramural 3.5 x 2.9 x 3.5 cm fibroid

Endometrium

Nondiagnostic evaluation of the endometrium due to poor
visualization.

Right ovary

Nonvisualization of the right ovary.  No right adnexal masses.

Left ovary

Measurements: 4.1 x 3.2 x 3.5 cm (transabdominal measurements) =
volume: 24 mL. Normal appearance/no adnexal mass.

Other findings

No abnormal free fluid.
IMPRESSION: 1. Limited scan. Bulky myomatous uterus with representative fibroids
as detailed. MRI pelvis without and with IV contrast may be
considered for further evaluation given the limitations of this
ultrasound study.
2. Nondiagnostic evaluation of the endometrium due to poor
visualization.
3. Nonvisualization of the right ovary. Normal left ovary. No
abnormal adnexal masses.

## 2021-12-01 ENCOUNTER — Other Ambulatory Visit: Payer: Self-pay | Admitting: Internal Medicine

## 2021-12-01 ENCOUNTER — Ambulatory Visit (INDEPENDENT_AMBULATORY_CARE_PROVIDER_SITE_OTHER): Payer: 59 | Admitting: Internal Medicine

## 2021-12-01 ENCOUNTER — Encounter: Payer: Self-pay | Admitting: Internal Medicine

## 2021-12-01 VITALS — BP 132/86 | HR 62 | Ht 63.0 in | Wt 263.0 lb

## 2021-12-01 DIAGNOSIS — Z6841 Body Mass Index (BMI) 40.0 and over, adult: Secondary | ICD-10-CM | POA: Insufficient documentation

## 2021-12-01 DIAGNOSIS — R7303 Prediabetes: Secondary | ICD-10-CM | POA: Diagnosis not present

## 2021-12-01 MED ORDER — SAXENDA 18 MG/3ML ~~LOC~~ SOPN: PEN_INJECTOR | SUBCUTANEOUS | 0 refills | Status: AC

## 2021-12-01 MED ORDER — PEN NEEDLES 31G X 5 MM MISC: 1.0000 | Freq: Every day | 0 refills | Status: DC

## 2021-12-01 NOTE — Telephone Encounter (Signed)
Requested medication (s) are due for refill today: no  Requested medication (s) are on the active medication list: yes    Last refill: today  Future visit scheduled yes 02/02/25  Notes to clinic:Pharmacy comment: Alternative Requested:NOT COVERED. Please review. Thank you  Requested Prescriptions  Pending Prescriptions Disp Refills   SAXENDA 18 MG/3ML SOPN [Pharmacy Med Name: SAXENDA 18 MG/3 ML PEN]  0    Sig: Inject 0.6 mg into the skin daily for 7 days, THEN 1.2 mg daily for 7 days, THEN 1.8 mg daily for 28 days.     Endocrinology:  Diabetes - GLP-1 Receptor Agonists Failed - 12/01/2021 11:40 AM      Failed - HBA1C is between 0 and 7.9 and within 180 days    Hgb A1c MFr Bld  Date Value Ref Range Status  01/14/2021 5.9 (H) 4.8 - 5.6 % Final    Comment:             Prediabetes: 5.7 - 6.4          Diabetes: >6.4          Glycemic control for adults with diabetes: <7.0          Passed - Valid encounter within last 6 months    Recent Outpatient Visits           Today BMI 45.0-49.9, adult [facility])   [facility] Glean Hess, MD   10 months ago Annual physical exam   [facility] Glean Hess, MD   2 years ago Annual physical exam   [facility] Glean Hess, MD   3 years ago Annual physical exam   [facility] Glean Hess, MD   3 years ago Sebaceous cyst of skin of left breast   [facility] Glean Hess, MD       Future Appointments             In 2 months Army Melia Jesse Sans, MD [facility], [facility]

## 2021-12-01 NOTE — Progress Notes (Signed)
Date:  12/01/2021   Name:  Kathryn George   DOB:  28-Nov-1975   MRN:  [id number]   Chief Complaint: Obesity ([place] )  HPI Obesity: Patient presents today wanting to discuss weight loss medication. She is interested in trying [place]. She is a prediabetic. Her BMI is currently 46.59, and her starting weight is 263 lb. She walks many steps at work.  She has cut back on soda, uses B12 supplements. She mostly eats home cooked. She wanted to try [facility] but it is on national back order.  Lab Results  Component Value Date   NA 138 01/14/2021   K 3.7 01/14/2021   CO2 21 01/14/2021   GLUCOSE 86 01/14/2021   BUN 7 01/14/2021   CREATININE 0.74 01/14/2021   CALCIUM 9.1 01/14/2021   EGFR 100 01/14/2021   GFRNONAA >60 09/12/2020   Lab Results  Component Value Date   CHOL 187 01/14/2021   HDL 57 01/14/2021   LDLCALC 109 (H) 01/14/2021   TRIG 121 01/14/2021   CHOLHDL 3.3 01/14/2021   Lab Results  Component Value Date   TSH 0.704 01/14/2021   Lab Results  Component Value Date   HGBA1C 5.9 (H) 01/14/2021   Lab Results  Component Value Date   WBC 5.2 03/11/2021   HGB 10.8 (L) 03/11/2021   HCT 36.1 03/11/2021   MCV 70 (L) 03/11/2021   PLT 336 03/11/2021   Lab Results  Component Value Date   ALT 9 01/14/2021   AST 8 01/14/2021   ALKPHOS 69 01/14/2021   BILITOT 0.2 01/14/2021   Lab Results  Component Value Date   VD25OH 13.4 (L) 10/15/2019     Review of Systems  Constitutional:  Negative for chills, fatigue and unexpected weight change.  HENT:  Negative for nosebleeds.   Eyes:  Negative for visual disturbance.  Respiratory:  Negative for cough, chest tightness, shortness of breath and wheezing.   Cardiovascular:  Negative for chest pain, palpitations and leg swelling.  Gastrointestinal:  Negative for abdominal pain, constipation and diarrhea.  Neurological:  Negative for dizziness, weakness, light-headedness and headaches.    Patient Active  Problem List   Diagnosis Date Noted   Abnormal uterine bleeding (AUB) 09/16/2020   Intramural, submucous, and subserous leiomyoma of uterus 07/31/2020   Abnormal uterine bleeding 07/31/2020   Microcytic anemia 07/31/2020   Vitamin D deficiency 10/15/2019   Migraine without aura and without status migrainosus, not intractable 10/15/2019   Sebaceous cyst of skin of left breast 08/25/2018   Hyperlipidemia, mixed 04/17/2017   Menorrhagia with regular cycle 10/04/2016   Environmental and seasonal allergies 10/04/2016    Allergies  Allergen Reactions   Shellfish Allergy Shortness Of Breath and Swelling    CRAB AND LOBSTER    Past Surgical History:  Procedure Laterality Date   DILATION AND CURETTAGE OF UTERUS  09/16/2020   Procedure: DILATATION AND CURETTAGE;  Surgeon: Vena Austria, MD;  Location: [facility] ORS;  Service: Gynecology;;   [name]  09/16/2020   Procedure: VAGINAL MYOMECTOMY;  Surgeon: Vena Austria, MD;  Location: [facility] ORS;  Service: Gynecology;;    Social History   Tobacco Use   Smoking status: Former    Passive exposure: Past   Smokeless tobacco: Never  Vaping Use   Vaping Use: Never used  Substance Use Topics   Alcohol use: Yes    Comment: social   Drug use: No     Medication list has been reviewed and updated.  No  outpatient medications have been marked as taking for the 12/01/21 encounter (Office Visit) with Glean Hess, MD.       12/01/2021   10:38 AM 01/14/2021   10:20 AM 10/15/2019   10:28 AM  GAD 7 : Generalized Anxiety Score  Nervous, Anxious, on Edge 0 2 0  Control/stop worrying 0 1 0  Worry too much - different things 0 1 0  Trouble relaxing 0 1 0  Restless 0 0 0  Easily annoyed or irritable 0 0 0  Afraid - awful might happen 0 0 0  Total GAD 7 Score 0 5 0  Anxiety Difficulty Not difficult at all  Not difficult at all       12/01/2021   10:38 AM  Depression screen PHQ 2/9  Decreased Interest 0  Down, Depressed, Hopeless  0  PHQ - 2 Score 0  Altered sleeping 0  Tired, decreased energy 0  Change in appetite 0  Feeling bad or failure about yourself  0  Trouble concentrating 0  Moving slowly or fidgety/restless 0  Suicidal thoughts 0  PHQ-9 Score 0  Difficult doing work/chores Not difficult at all    BP Readings from Last 3 Encounters:  12/01/21 132/86  01/14/21 124/82  10/31/20 120/80    Physical Exam Vitals and nursing note reviewed.  Constitutional:      General: She is not in acute distress.    Appearance: She is well-developed.  HENT:     Head: Normocephalic and atraumatic.  Cardiovascular:     Rate and Rhythm: Normal rate and regular rhythm.  Pulmonary:     Effort: Pulmonary effort is normal. No respiratory distress.     Breath sounds: No wheezing or rhonchi.  Musculoskeletal:     Right lower leg: No edema.     Left lower leg: No edema.  Skin:    General: Skin is warm and dry.     Findings: No rash.  Neurological:     Mental Status: She is alert and oriented to person, place, and time.  Psychiatric:        Mood and Affect: Mood normal.        Behavior: Behavior normal.     Wt Readings from Last 3 Encounters:  12/01/21 263 lb (119.3 kg)  01/14/21 248 lb (112.5 kg)  10/31/20 250 lb (113.4 kg)    BP 132/86   Pulse 62   Ht $R'5\' 3"'ef$  (1.6 m)   Wt 263 lb (119.3 kg)   SpO2 97%   BMI 46.59 kg/m   Assessment and Plan: 1. BMI 45.0-49.9, adult (HCC) Begin Saxenda 0.6 mg and titrate up weekly if tolerated Continue diet changes - esp reduced carb choices More regular exercise outside of the steps she takes at work - Liraglutide -Weight Management (SAXENDA) 18 MG/3ML SOPN; Inject 0.6 mg into the skin daily for 7 days, THEN 1.2 mg daily for 7 days, THEN 1.8 mg daily for 28 days.  Dispense: 12 mL; Refill: 0 - Insulin Pen Needle (PEN NEEDLES) 31G X 5 MM MISC; 1 each by Does not apply route daily at 6 (six) AM.  Dispense: 100 each; Refill: 0  2. Prediabetes Treatment discussed -  continue to work on diet and weight loss   Partially dictated using Editor, commissioning. Any errors are unintentional.  Halina Maidens, MD [facility]  12/01/2021

## 2021-12-02 ENCOUNTER — Telehealth: Payer: Self-pay

## 2021-12-02 NOTE — Telephone Encounter (Signed)
Working on Prior [employer] for [employer]. Waiting for response from insurance.

## 2021-12-02 NOTE — Telephone Encounter (Signed)
Completed PA on [email or web address] for Saxenda. Awaiting outcome from insurance company.

## 2021-12-03 ENCOUNTER — Ambulatory Visit: Payer: BC Managed Care – PPO | Admitting: Internal Medicine

## 2021-12-03 ENCOUNTER — Other Ambulatory Visit: Payer: Self-pay | Admitting: Internal Medicine

## 2021-12-03 DIAGNOSIS — Z6841 Body Mass Index (BMI) 40.0 and over, adult: Secondary | ICD-10-CM

## 2021-12-03 NOTE — Telephone Encounter (Signed)
Requested medication (s) are due for refill today: no  Requested medication (s) are on the active medication list: yes  Last refill:  12/01/21  Future visit scheduled: yes  Notes to clinic:  Unable to refill per protocol, pharmacy request:Alternative Requested:NOT COVERED. A prior authorization be required.    Requested Prescriptions  Pending Prescriptions Disp Refills   SAXENDA 18 MG/3ML SOPN [Pharmacy Med Name: SAXENDA 18 MG/3 ML PEN]  0    Sig: Inject 0.6 mg into the skin daily for 7 days, THEN 1.2 mg daily for 7 days, THEN 1.8 mg daily for 28 days.     Endocrinology:  Diabetes - GLP-1 Receptor Agonists Failed - 12/03/2021 12:10 PM      Failed - HBA1C is between 0 and 7.9 and within 180 days    Hgb A1c MFr Bld  Date Value Ref Range Status  01/14/2021 5.9 (H) 4.8 - 5.6 % Final    Comment:             Prediabetes: 5.7 - 6.4          Diabetes: >6.4          Glycemic control for adults with diabetes: <7.0          Passed - Valid encounter within last 6 months    Recent Outpatient Visits           2 days ago BMI 45.0-49.9, adult [facility])   [facility] Glean Hess, MD   10 months ago Annual physical exam   [facility] Glean Hess, MD   2 years ago Annual physical exam   [facility] Glean Hess, MD   3 years ago Annual physical exam   [facility] Glean Hess, MD   3 years ago Sebaceous cyst of skin of left breast   [facility] Glean Hess, MD       Future Appointments             In 1 month Army Melia Jesse Sans, MD [facility] [place]   In 2 months Army Melia, Jesse Sans, MD [facility], [facility]

## 2022-01-01 NOTE — Telephone Encounter (Signed)
PA DENIED

## 2022-01-12 ENCOUNTER — Encounter: Payer: Self-pay | Admitting: Internal Medicine

## 2022-01-12 ENCOUNTER — Ambulatory Visit (INDEPENDENT_AMBULATORY_CARE_PROVIDER_SITE_OTHER): Payer: 59 | Admitting: Internal Medicine

## 2022-01-12 ENCOUNTER — Ambulatory Visit: Payer: BC Managed Care – PPO | Admitting: Internal Medicine

## 2022-01-12 VITALS — BP 128/84 | HR 65 | Ht 63.0 in | Wt 254.0 lb

## 2022-01-12 DIAGNOSIS — Z6841 Body Mass Index (BMI) 40.0 and over, adult: Secondary | ICD-10-CM

## 2022-01-12 DIAGNOSIS — R7303 Prediabetes: Secondary | ICD-10-CM | POA: Diagnosis not present

## 2022-01-12 MED ORDER — WEGOVY 1.7 MG/0.75ML ~~LOC~~ SOAJ: 1.7000 mg | SUBCUTANEOUS | 0 refills | Status: DC

## 2022-01-12 NOTE — Progress Notes (Addendum)
Date:  01/12/2022   Name:  Kathryn George   DOB:  1975-08-05   MRN:  [id number]   Chief Complaint: Weight Check (Down 9 lbs- wants to try wegovy-1200$ for saxenda ) Weight management - pt is here for weight management follow up.  Started on Saxenda on June.  Doing well without side effects.  Starting weight 263 lbs.   Today's weight 254 lbs.  Current diet is low carb and current exercise routine is walking. Saxenda dose is 1.8 mg. She paid cash for [employer].  According to her research into her insurance, the website lists all the medications as being covered.  She would like to try [facility] since the backorder may be caught up.   HPI  Lab Results  Component Value Date   NA 138 01/14/2021   K 3.7 01/14/2021   CO2 21 01/14/2021   GLUCOSE 86 01/14/2021   BUN 7 01/14/2021   CREATININE 0.74 01/14/2021   CALCIUM 9.1 01/14/2021   EGFR 100 01/14/2021   GFRNONAA >60 09/12/2020   Lab Results  Component Value Date   CHOL 187 01/14/2021   HDL 57 01/14/2021   LDLCALC 109 (H) 01/14/2021   TRIG 121 01/14/2021   CHOLHDL 3.3 01/14/2021   Lab Results  Component Value Date   TSH 0.704 01/14/2021   Lab Results  Component Value Date   HGBA1C 5.9 (H) 01/14/2021   Lab Results  Component Value Date   WBC 5.2 03/11/2021   HGB 10.8 (L) 03/11/2021   HCT 36.1 03/11/2021   MCV 70 (L) 03/11/2021   PLT 336 03/11/2021   Lab Results  Component Value Date   ALT 9 01/14/2021   AST 8 01/14/2021   ALKPHOS 69 01/14/2021   BILITOT 0.2 01/14/2021   Lab Results  Component Value Date   VD25OH 13.4 (L) 10/15/2019     Review of Systems  Constitutional:  Positive for unexpected weight change (has lost 9 lbs). Negative for fatigue.  HENT:  Negative for nosebleeds.   Eyes:  Negative for visual disturbance.  Respiratory:  Negative for cough, chest tightness, shortness of breath and wheezing.   Cardiovascular:  Negative for chest pain, palpitations and leg swelling.  Gastrointestinal:   Negative for abdominal pain, constipation and diarrhea.  Neurological:  Negative for dizziness, weakness, light-headedness and headaches.    Patient Active Problem List   Diagnosis Date Noted   Prediabetes 12/01/2021   BMI 45.0-49.9, adult ([place]) 12/01/2021   Abnormal uterine bleeding (AUB) 09/16/2020   Intramural, submucous, and subserous leiomyoma of uterus 07/31/2020   Abnormal uterine bleeding 07/31/2020   Microcytic anemia 07/31/2020   Vitamin D deficiency 10/15/2019   Migraine without aura and without status migrainosus, not intractable 10/15/2019   Sebaceous cyst of skin of left breast 08/25/2018   Hyperlipidemia, mixed 04/17/2017   Menorrhagia with regular cycle 10/04/2016   Environmental and seasonal allergies 10/04/2016    Allergies  Allergen Reactions   Shellfish Allergy Shortness Of Breath and Swelling    CRAB AND LOBSTER    Past Surgical History:  Procedure Laterality Date   DILATION AND CURETTAGE OF UTERUS  09/16/2020   Procedure: DILATATION AND CURETTAGE;  Surgeon: Malachy Mood, MD;  Location: [facility] ORS;  Service: Gynecology;;   Amado Coe  09/16/2020   Procedure: VAGINAL MYOMECTOMY;  Surgeon: Malachy Mood, MD;  Location: [facility] ORS;  Service: Gynecology;;    Social History   Tobacco Use   Smoking status: Former    Passive exposure: Past  Smokeless tobacco: Never  Vaping Use   Vaping Use: Never used  Substance Use Topics   Alcohol use: Yes    Comment: social   Drug use: No     Medication list has been reviewed and updated.  Current Meds  Medication Sig   ibuprofen (ADVIL) 800 MG tablet Take 1 tablet (800 mg total) by mouth as needed.   Insulin Pen Needle (PEN NEEDLES) 31G X 5 MM MISC 1 each by Does not apply route daily at 6 (six) AM.   Liraglutide -Weight Management (SAXENDA) 18 MG/3ML SOPN Inject 0.6 mg into the skin daily for 7 days, THEN 1.2 mg daily for 7 days, THEN 1.8 mg daily for 28 days.       01/12/2022    9:33 AM 12/01/2021    10:52 AM 12/01/2021   10:38 AM 01/14/2021   10:20 AM  GAD 7 : Generalized Anxiety Score  Nervous, Anxious, on Edge 0 0 0 2  Control/stop worrying 0 0 0 1  Worry too much - different things 2 2 0 1  Trouble relaxing 1 0 0 1  Restless 1 2 0 0  Easily annoyed or irritable 0 0 0 0  Afraid - awful might happen 0 0 0 0  Total GAD 7 Score 4 4 0 5  Anxiety Difficulty Not difficult at all Not difficult at all Not difficult at all        01/12/2022    9:32 AM 12/01/2021   10:52 AM 12/01/2021   10:38 AM  Depression screen PHQ 2/9  Decreased Interest 0 0 0  Down, Depressed, Hopeless 0 0 0  PHQ - 2 Score 0 0 0  Altered sleeping 0 2 0  Tired, decreased energy 1 1 0  Change in appetite 0 0 0  Feeling bad or failure about yourself  0 0 0  Trouble concentrating 0 0 0  Moving slowly or fidgety/restless 0 0 0  Suicidal thoughts 0 0 0  PHQ-9 Score 1 3 0  Difficult doing work/chores Not difficult at all Somewhat difficult Not difficult at all    BP Readings from Last 3 Encounters:  01/12/22 128/84  12/01/21 132/86  01/14/21 124/82    Physical Exam Vitals and nursing note reviewed.  Constitutional:      General: She is not in acute distress.    Appearance: She is well-developed. She is obese.  HENT:     Head: Normocephalic and atraumatic.  Cardiovascular:     Rate and Rhythm: Normal rate and regular rhythm.  Pulmonary:     Effort: Pulmonary effort is normal. No respiratory distress.     Breath sounds: No wheezing or rhonchi.  Abdominal:     Palpations: Abdomen is soft.     Tenderness: There is no abdominal tenderness.  Musculoskeletal:     Cervical back: Normal range of motion.  Skin:    General: Skin is warm and dry.     Findings: No rash.  Neurological:     Mental Status: She is alert and oriented to person, place, and time.  Psychiatric:        Mood and Affect: Mood normal.        Behavior: Behavior normal.     Wt Readings from Last 3 Encounters:  01/12/22 254 lb (115.2  kg)  12/01/21 263 lb (119.3 kg)  01/14/21 248 lb (112.5 kg)    BP 128/84   Pulse 65   Ht $R'5\' 3"'ez$  (1.6 m)   Wt  254 lb (115.2 kg)   SpO2 99%   BMI 44.99 kg/m   Assessment and Plan: 1. Prediabetes Continue weight loss efforts Wegovy as below  2. BMI 45.0-49.9, adult ([place]) Has done well on Saxenda but PA denied Will send in [place] and do PA if needed Follow up next month at time of CPX - Semaglutide-Weight Management (WEGOVY) 1.7 MG/0.75ML SOAJ; Inject 1.7 mg into the skin once a week.  Dispense: 3 mL; Refill: 0   Partially dictated using Editor, commissioning. Any errors are unintentional.  Halina Maidens, MD [facility]  01/12/2022

## 2022-02-02 ENCOUNTER — Encounter: Payer: BC Managed Care – PPO | Admitting: Internal Medicine

## 2022-02-04 ENCOUNTER — Telehealth: Payer: Self-pay

## 2022-02-04 ENCOUNTER — Ambulatory Visit (INDEPENDENT_AMBULATORY_CARE_PROVIDER_SITE_OTHER): Payer: BC Managed Care – PPO | Admitting: Internal Medicine

## 2022-02-04 ENCOUNTER — Encounter: Payer: Self-pay | Admitting: Internal Medicine

## 2022-02-04 ENCOUNTER — Other Ambulatory Visit (HOSPITAL_COMMUNITY)
Admission: RE | Admit: 2022-02-04 | Discharge: 2022-02-04 | Disposition: A | Discharge status: Home / Self Care | Payer: 59 | Source: Ambulatory Visit | Attending: Internal Medicine | Admitting: Internal Medicine

## 2022-02-04 VITALS — BP 128/72 | HR 60 | Ht 63.0 in | Wt 255.0 lb

## 2022-02-04 DIAGNOSIS — Z1231 Encounter for screening mammogram for malignant neoplasm of breast: Secondary | ICD-10-CM | POA: Diagnosis not present

## 2022-02-04 DIAGNOSIS — E782 Mixed hyperlipidemia: Secondary | ICD-10-CM

## 2022-02-04 DIAGNOSIS — Z0000 Encounter for general adult medical examination without abnormal findings: Secondary | ICD-10-CM | POA: Diagnosis not present

## 2022-02-04 DIAGNOSIS — R7303 Prediabetes: Secondary | ICD-10-CM

## 2022-02-04 DIAGNOSIS — Z124 Encounter for screening for malignant neoplasm of cervix: Secondary | ICD-10-CM | POA: Insufficient documentation

## 2022-02-04 DIAGNOSIS — Z1211 Encounter for screening for malignant neoplasm of colon: Secondary | ICD-10-CM | POA: Diagnosis not present

## 2022-02-04 DIAGNOSIS — Z6841 Body Mass Index (BMI) 40.0 and over, adult: Secondary | ICD-10-CM

## 2022-02-04 DIAGNOSIS — E559 Vitamin D deficiency, unspecified: Secondary | ICD-10-CM

## 2022-02-04 NOTE — Telephone Encounter (Signed)
DENIED. Pt insurance does not cover weight loss medications.

## 2022-02-04 NOTE — Progress Notes (Signed)
Date:  02/04/2022   Name:  Azya Barbero   DOB:  Jun 03, 1976   MRN:  [id number]   Chief Complaint: Annual Exam (Breast Exam, and Pap smear. ) Lynford Humphrey Chaquana Nichols is a 46 y.o. female who presents today for her Complete Annual Exam. She feels well. She reports walking. She reports she is sleeping poorly. Breast complaints - none.    Mammogram: 02/2021 [facility] DEXA: none Pap smear: 09/2018 neg thin prep Colonoscopy: none  Health Maintenance Due  Topic Date Due   TETANUS/TDAP  Never done   COLONOSCOPY (Pts 45-50yrs Insurance coverage will need to be confirmed)  Never done   COVID-19 Vaccine (4 - Moderna risk series) 08/10/2020   PAP SMEAR-Modifier  10/01/2021    Immunization History  Administered Date(s) Administered   Moderna Sars-Covid-2 Vaccination 09/14/2019, 10/12/2019, 06/15/2020    HPI  Lab Results  Component Value Date   NA 138 01/14/2021   K 3.7 01/14/2021   CO2 21 01/14/2021   GLUCOSE 86 01/14/2021   BUN 7 01/14/2021   CREATININE 0.74 01/14/2021   CALCIUM 9.1 01/14/2021   EGFR 100 01/14/2021   GFRNONAA >60 09/12/2020   Lab Results  Component Value Date   CHOL 187 01/14/2021   HDL 57 01/14/2021   LDLCALC 109 (H) 01/14/2021   TRIG 121 01/14/2021   CHOLHDL 3.3 01/14/2021   Lab Results  Component Value Date   TSH 0.704 01/14/2021   Lab Results  Component Value Date   HGBA1C 5.9 (H) 01/14/2021   Lab Results  Component Value Date   WBC 5.2 03/11/2021   HGB 10.8 (L) 03/11/2021   HCT 36.1 03/11/2021   MCV 70 (L) 03/11/2021   PLT 336 03/11/2021   Lab Results  Component Value Date   ALT 9 01/14/2021   AST 8 01/14/2021   ALKPHOS 69 01/14/2021   BILITOT 0.2 01/14/2021   Lab Results  Component Value Date   VD25OH 13.4 (L) 10/15/2019     Review of Systems  Constitutional:  Negative for chills, fatigue and fever.  HENT:  Negative for congestion, hearing loss, tinnitus, trouble swallowing and voice change.   Eyes:  Negative for  visual disturbance.  Respiratory:  Negative for cough, chest tightness, shortness of breath and wheezing.   Cardiovascular:  Negative for chest pain, palpitations and leg swelling.  Gastrointestinal:  Negative for abdominal pain, constipation, diarrhea and vomiting.  Endocrine: Negative for polydipsia and polyuria.  Genitourinary:  Negative for dysuria, frequency, genital sores, vaginal bleeding and vaginal discharge.  Musculoskeletal:  Negative for arthralgias, gait problem and joint swelling.  Skin:  Negative for color change and rash.  Neurological:  Negative for dizziness, tremors, light-headedness and headaches.  Hematological:  Negative for adenopathy. Does not bruise/bleed easily.  Psychiatric/Behavioral:  Negative for dysphoric mood and sleep disturbance. The patient is not nervous/anxious.     Patient Active Problem List   Diagnosis Date Noted   Prediabetes 12/01/2021   BMI 40.0-44.9, adult (HCC) 12/01/2021   Abnormal uterine bleeding (AUB) 09/16/2020   Intramural, submucous, and subserous leiomyoma of uterus 07/31/2020   Abnormal uterine bleeding 07/31/2020   Microcytic anemia 07/31/2020   Vitamin D deficiency 10/15/2019   Migraine without aura and without status migrainosus, not intractable 10/15/2019   Sebaceous cyst of skin of left breast 08/25/2018   Hyperlipidemia, mixed 04/17/2017   Menorrhagia with regular cycle 10/04/2016   Environmental and seasonal allergies 10/04/2016    Allergies  Allergen Reactions   Shellfish Allergy  Shortness Of Breath and Swelling    CRAB AND LOBSTER    Past Surgical History:  Procedure Laterality Date   DILATION AND CURETTAGE OF UTERUS  09/16/2020   Procedure: DILATATION AND CURETTAGE;  Surgeon: Malachy Mood, MD;  Location: [facility] ORS;  Service: Gynecology;;   Amado Coe  09/16/2020   Procedure: VAGINAL MYOMECTOMY;  Surgeon: Malachy Mood, MD;  Location: [facility] ORS;  Service: Gynecology;;    Social History   Tobacco Use    Smoking status: Former    Passive exposure: Past   Smokeless tobacco: Never  Vaping Use   Vaping Use: Never used  Substance Use Topics   Alcohol use: Yes    Comment: social   Drug use: No     Medication list has been reviewed and updated.  Current Meds  Medication Sig   ibuprofen (ADVIL) 800 MG tablet Take 1 tablet (800 mg total) by mouth as needed.   Insulin Pen Needle (PEN NEEDLES) 31G X 5 MM MISC 1 each by Does not apply route daily at 6 (six) AM.       02/04/2022    9:02 AM 01/12/2022    9:33 AM 12/01/2021   10:52 AM 12/01/2021   10:38 AM  GAD 7 : Generalized Anxiety Score  Nervous, Anxious, on Edge 1 0 0 0  Control/stop worrying 1 0 0 0  Worry too much - different things $RemoveBeforeDE'2 2 2 'ZbmHZUbMasQmail$ 0  Trouble relaxing 2 1 0 0  Restless $RemoveB'2 1 2 'YUyAkqnI$ 0  Easily annoyed or irritable 2 0 0 0  Afraid - awful might happen 0 0 0 0  Total GAD 7 Score $Remov'10 4 4 'nqvjiW$ 0  Anxiety Difficulty Somewhat difficult Not difficult at all Not difficult at all Not difficult at all       02/04/2022    9:01 AM 01/12/2022    9:32 AM 12/01/2021   10:52 AM  Depression screen PHQ 2/9  Decreased Interest 1 0 0  Down, Depressed, Hopeless 1 0 0  PHQ - 2 Score 2 0 0  Altered sleeping 2 0 2  Tired, decreased energy 0 1 1  Change in appetite 2 0 0  Feeling bad or failure about yourself  0 0 0  Trouble concentrating 1 0 0  Moving slowly or fidgety/restless 1 0 0  Suicidal thoughts 0 0 0  PHQ-9 Score $RemoveBef'8 1 3  'EDRfUtLupf$ Difficult doing work/chores Somewhat difficult Not difficult at all Somewhat difficult    BP Readings from Last 3 Encounters:  02/04/22 128/72  01/12/22 128/84  12/01/21 132/86    Physical Exam Vitals and nursing note reviewed.  Constitutional:      General: She is not in acute distress.    Appearance: She is well-developed.  HENT:     Head: Normocephalic and atraumatic.     Right Ear: Tympanic membrane and ear canal normal.     Left Ear: Tympanic membrane and ear canal normal.     Nose:     Right Sinus: No  maxillary sinus tenderness.     Left Sinus: No maxillary sinus tenderness.  Eyes:     General: No scleral icterus.       Right eye: No discharge.        Left eye: No discharge.     Conjunctiva/sclera: Conjunctivae normal.  Neck:     Thyroid: No thyromegaly.     Vascular: No carotid bruit.  Cardiovascular:     Rate and Rhythm: Normal rate and regular rhythm.  Pulses: Normal pulses.     Heart sounds: Normal heart sounds.  Pulmonary:     Effort: Pulmonary effort is normal. No respiratory distress.     Breath sounds: No wheezing.  Chest:  Breasts:    Right: No mass, nipple discharge, skin change or tenderness.     Left: No mass, nipple discharge, skin change or tenderness.  Abdominal:     General: Bowel sounds are normal.     Palpations: Abdomen is soft.     Tenderness: There is no abdominal tenderness.  Genitourinary:    Labia:        Right: No tenderness, lesion or injury.        Left: No tenderness, lesion or injury.      Vagina: Normal.     Cervix: Normal.     Uterus: Normal.      Adnexa: Right adnexa normal and left adnexa normal.  Musculoskeletal:     Cervical back: Normal range of motion. No erythema.     Right lower leg: No edema.     Left lower leg: No edema.  Lymphadenopathy:     Cervical: No cervical adenopathy.  Skin:    General: Skin is warm and dry.     Findings: No rash.  Neurological:     Mental Status: She is alert and oriented to person, place, and time.     Cranial Nerves: No cranial nerve deficit.     Sensory: No sensory deficit.     Deep Tendon Reflexes: Reflexes are normal and symmetric.  Psychiatric:        Attention and Perception: Attention normal.        Mood and Affect: Mood normal.     Wt Readings from Last 3 Encounters:  02/04/22 255 lb (115.7 kg)  01/12/22 254 lb (115.2 kg)  12/01/21 263 lb (119.3 kg)    BP 128/72   Pulse 60   Ht $R'5\' 3"'en$  (1.6 m)   Wt 255 lb (115.7 kg)   LMP 01/02/2022 (Approximate)   BMI 45.17 kg/m    Assessment and Plan: 1. Annual physical exam Exam is normal except for weight. Encourage regular exercise and appropriate dietary changes. Trying to get approval for Wegovy - CBC with Differential/Platelet  2. Encounter for screening mammogram for breast cancer To be scheduled at [facility]  3. Colon cancer screening - Ambulatory referral to Gastroenterology  4. Prediabetes Check labs; continue to work on diet and weight loss - Comprehensive metabolic panel - Hemoglobin A1c  5. Hyperlipidemia, mixed Check labs and advise if medications are needed - Lipid panel  6. Vitamin D deficiency Check level and advise on supplements - VITAMIN D 25 Hydroxy (Vit-D Deficiency, Fractures)  7. Encounter for screening for cervical cancer Declined STI testing - Cytology - PAP  8. BMI 40.0-44.9, adult [facility]) Awaiting insurance decision on medications. - TSH   Partially dictated using Editor, commissioning. Any errors are unintentional.  Halina Maidens, MD [facility]  02/04/2022

## 2022-02-04 NOTE — Telephone Encounter (Signed)
Completed PA on [email or web address] for Wegovy 1.7 mg. Awaiting outcome.  (Key: [id number])  - Kele Barthelemy

## 2022-02-05 LAB — CBC WITH DIFFERENTIAL/PLATELET
Basophils Absolute: 0.1 10*3/uL (ref 0.0–0.2)
Basos: 1 %
EOS (ABSOLUTE): 0.1 10*3/uL (ref 0.0–0.4)
Eos: 2 %
Hematocrit: 37.3 % (ref 34.0–46.6)
Hemoglobin: 11.9 g/dL (ref 11.1–15.9)
Immature Grans (Abs): 0 10*3/uL (ref 0.0–0.1)
Immature Granulocytes: 0 %
Lymphocytes Absolute: 1.7 10*3/uL (ref 0.7–3.1)
Lymphs: 33 %
MCH: 23.7 pg — ABNORMAL LOW (ref 26.6–33.0)
MCHC: 31.9 g/dL (ref 31.5–35.7)
MCV: 74 fL — ABNORMAL LOW (ref 79–97)
Monocytes Absolute: 0.5 10*3/uL (ref 0.1–0.9)
Monocytes: 10 %
Neutrophils Absolute: 2.7 10*3/uL (ref 1.4–7.0)
Neutrophils: 54 %
Platelets: 297 10*3/uL (ref 150–450)
RBC: 5.03 x10E6/uL (ref 3.77–5.28)
RDW: 14.2 % (ref 11.7–15.4)
WBC: 5 10*3/uL (ref 3.4–10.8)

## 2022-02-05 LAB — VITAMIN D 25 HYDROXY (VIT D DEFICIENCY, FRACTURES): Vit D, 25-Hydroxy: 15.8 ng/mL — ABNORMAL LOW (ref 30.0–100.0)

## 2022-02-05 LAB — COMPREHENSIVE METABOLIC PANEL
ALT: 21 IU/L (ref 0–32)
AST: 16 IU/L (ref 0–40)
Albumin/Globulin Ratio: 1.3 (ref 1.2–2.2)
Albumin: 4 g/dL (ref 3.9–4.9)
Alkaline Phosphatase: 66 IU/L (ref 44–121)
BUN/Creatinine Ratio: 11 (ref 9–23)
BUN: 8 mg/dL (ref 6–24)
Bilirubin Total: 0.3 mg/dL (ref 0.0–1.2)
CO2: 20 mmol/L (ref 20–29)
Calcium: 9.1 mg/dL (ref 8.7–10.2)
Chloride: 102 mmol/L (ref 96–106)
Creatinine, Ser: 0.75 mg/dL (ref 0.57–1.00)
Globulin, Total: 3 g/dL (ref 1.5–4.5)
Glucose: 84 mg/dL (ref 70–99)
Potassium: 4.2 mmol/L (ref 3.5–5.2)
Sodium: 137 mmol/L (ref 134–144)
Total Protein: 7 g/dL (ref 6.0–8.5)
eGFR: 98 mL/min/{1.73_m2} (ref >59)

## 2022-02-05 LAB — LIPID PANEL
Chol/HDL Ratio: 3.6 ratio (ref 0.0–4.4)
Cholesterol, Total: 191 mg/dL (ref 100–199)
HDL: 53 mg/dL (ref >39)
LDL Chol Calc (NIH): 116 mg/dL — ABNORMAL HIGH (ref 0–99)
Triglycerides: 123 mg/dL (ref 0–149)
VLDL Cholesterol Cal: 22 mg/dL (ref 5–40)

## 2022-02-05 LAB — TSH: TSH: 1.17 u[IU]/mL (ref 0.450–4.500)

## 2022-02-05 LAB — HEMOGLOBIN A1C
Est. average glucose Bld gHb Est-mCnc: 117 mg/dL
Hgb A1c MFr Bld: 5.7 % — ABNORMAL HIGH (ref 4.8–5.6)

## 2022-02-08 ENCOUNTER — Telehealth: Payer: Self-pay

## 2022-02-08 ENCOUNTER — Other Ambulatory Visit: Payer: Self-pay

## 2022-02-08 DIAGNOSIS — Z1211 Encounter for screening for malignant neoplasm of colon: Secondary | ICD-10-CM

## 2022-02-08 LAB — CYTOLOGY - PAP
Comment: NEGATIVE
Diagnosis: NEGATIVE
High risk HPV: NEGATIVE

## 2022-02-08 MED ORDER — NA SULFATE-K SULFATE-MG SULF 17.5-3.13-1.6 GM/177ML PO SOLN: 1.0000 | Freq: Once | ORAL | 0 refills | Status: AC

## 2022-02-08 NOTE — Telephone Encounter (Signed)
Gastroenterology Pre-Procedure Review  Request Date: 04/16/22 Requesting Physician: Dr. Vicente Males  PATIENT REVIEW QUESTIONS: The patient responded to the following health history questions as indicated:    1. Are you having any GI issues? no 2. Do you have a personal history of Polyps? no 3. Do you have a family history of Colon Cancer or Polyps? no 4. Diabetes Mellitus? no 5. Joint replacements in the past 12 months?yes (partial hysterectomy July 2023) 6. Major health problems in the past 3 months?no 7. Any artificial heart valves, MVP, or defibrillator?no    MEDICATIONS & ALLERGIES:    Patient reports the following regarding taking any anticoagulation/antiplatelet therapy:   Plavix, Coumadin, Eliquis, Xarelto, Lovenox, Pradaxa, Brilinta, or Effient? no Aspirin? no  Patient confirms/reports the following medications:  Current Outpatient Medications  Medication Sig Dispense Refill   ibuprofen (ADVIL) 800 MG tablet Take 1 tablet (800 mg total) by mouth as needed. 30 tablet 0   Insulin Pen Needle (PEN NEEDLES) 31G X 5 MM MISC 1 each by Does not apply route daily at 6 (six) AM. 100 each 0   Semaglutide-Weight Management (WEGOVY) 1.7 MG/0.75ML SOAJ Inject 1.7 mg into the skin once a week. (Patient not taking: Reported on 02/04/2022) 3 mL 0   No current facility-administered medications for this visit.    Patient confirms/reports the following allergies:  Allergies  Allergen Reactions   Shellfish Allergy Shortness Of Breath and Swelling    CRAB AND LOBSTER    No orders of the defined types were placed in this encounter.   AUTHORIZATION INFORMATION Primary Insurance: 1D#: Group #:  Secondary Insurance: 1D#: Group #:  SCHEDULE INFORMATION: Date: 04/16/22 Time: Location: [facility]

## 2022-03-17 LAB — HM MAMMOGRAPHY

## 2022-04-15 ENCOUNTER — Encounter: Payer: Self-pay | Admitting: Gastroenterology

## 2022-04-16 ENCOUNTER — Encounter
Admission: RE | Disposition: A | Discharge status: Home / Self Care | Payer: Self-pay | Source: Home / Self Care | Attending: Gastroenterology

## 2022-04-16 ENCOUNTER — Ambulatory Visit: Payer: 59 | Admitting: Anesthesiology

## 2022-04-16 ENCOUNTER — Ambulatory Visit
Admission: RE | Admit: 2022-04-16 | Discharge: 2022-04-16 | Disposition: A | Discharge status: Home / Self Care | Payer: 59 | Attending: Gastroenterology | Admitting: Gastroenterology

## 2022-04-16 ENCOUNTER — Encounter: Payer: Self-pay | Admitting: Gastroenterology

## 2022-04-16 DIAGNOSIS — Z87891 Personal history of nicotine dependence: Secondary | ICD-10-CM | POA: Insufficient documentation

## 2022-04-16 DIAGNOSIS — Z1211 Encounter for screening for malignant neoplasm of colon: Secondary | ICD-10-CM | POA: Insufficient documentation

## 2022-04-16 HISTORY — PX: COLONOSCOPY WITH PROPOFOL: SHX5780

## 2022-04-16 LAB — POCT PREGNANCY, URINE
Preg Test, Ur: NEGATIVE
Preg Test, Ur: NEGATIVE

## 2022-04-16 SURGERY — COLONOSCOPY WITH PROPOFOL
Anesthesia: General

## 2022-04-16 MED ORDER — SODIUM CHLORIDE 0.9 % IV SOLN
INTRAVENOUS | Status: DC
  Administered 2022-04-16: 1000 mL via INTRAVENOUS

## 2022-04-16 MED ORDER — STERILE WATER FOR IRRIGATION IR SOLN
Status: DC | PRN
  Administered 2022-04-16 (×2): 60 mL

## 2022-04-16 MED ORDER — PROPOFOL 10 MG/ML IV BOLUS
INTRAVENOUS | Status: DC | PRN
  Administered 2022-04-16: 80 mg via INTRAVENOUS

## 2022-04-16 MED ORDER — PROPOFOL 500 MG/50ML IV EMUL
INTRAVENOUS | Status: DC | PRN
  Administered 2022-04-16: 160 ug/kg/min via INTRAVENOUS

## 2022-04-16 NOTE — Anesthesia Preprocedure Evaluation (Signed)
Anesthesia Evaluation  Patient identified by MRN, date of birth, ID band Patient awake    Reviewed: Allergy & Precautions, NPO status , Patient's Chart, lab work & pertinent test results  History of Anesthesia Complications Negative for: history of anesthetic complications  Airway Mallampati: III  TM Distance: >3 FB Neck ROM: full    Dental  (+) Chipped   Pulmonary neg shortness of breath, former smoker,    Pulmonary exam normal        Cardiovascular Exercise Tolerance: Good (-) anginaNormal cardiovascular exam     Neuro/Psych  Headaches, PSYCHIATRIC DISORDERS    GI/Hepatic Neg liver ROS, GERD  Controlled,  Endo/Other  negative endocrine ROS  Renal/GU negative Renal ROS  negative genitourinary   Musculoskeletal   Abdominal   Peds  Hematology negative hematology ROS (+)   Anesthesia Other Findings Past Medical History: No date: Allergy     Comment:  year round allergies- eyes water. nose runs.  2014: Anxiety No date: GERD (gastroesophageal reflux disease)     Comment:  no meds No date: Headache     Comment:  migraines No date: Heart murmur     Comment:  h/o during pregnancy 2014: Insomnia  Past Surgical History: 09/16/2020: DILATION AND CURETTAGE OF UTERUS     Comment:  Procedure: DILATATION AND CURETTAGE;  Surgeon: Malachy Mood, MD;  Location: [facility] ORS;  Service: Gynecology;; 09/16/2020: MYOMECTOMY     Comment:  Procedure: VAGINAL MYOMECTOMY;  Surgeon: Malachy Mood, MD;  Location: [facility] ORS;  Service: Gynecology;;  BMI    Body Mass Index: 44.38 kg/m      Reproductive/Obstetrics negative OB ROS                             Anesthesia Physical Anesthesia Plan  ASA: 3  Anesthesia Plan: General   Post-op Pain Management:    Induction: Intravenous  PONV Risk Score and Plan: Propofol infusion and TIVA  Airway Management Planned:  Natural Airway and Nasal Cannula  Additional Equipment:   Intra-op Plan:   Post-operative Plan:   Informed Consent: I have reviewed the patients History and Physical, chart, labs and discussed the procedure including the risks, benefits and alternatives for the proposed anesthesia with the patient or authorized representative who has indicated his/her understanding and acceptance.     Dental Advisory Given  Plan Discussed with: Anesthesiologist, CRNA and Surgeon  Anesthesia Plan Comments: (Patient consented for risks of anesthesia including but not limited to:  - adverse reactions to medications - risk of airway placement if required - damage to eyes, teeth, lips or other oral mucosa - nerve damage due to positioning  - sore throat or hoarseness - Damage to heart, brain, nerves, lungs, other parts of body or loss of life  Patient voiced understanding.)        Anesthesia Quick Evaluation

## 2022-04-16 NOTE — Transfer of Care (Signed)
Immediate Anesthesia Transfer of Care Note  Patient: Kathryn George  Procedure(s) Performed: COLONOSCOPY WITH PROPOFOL  Patient Location: PACU  Anesthesia Type:General  Level of Consciousness: drowsy  Airway & Oxygen Therapy: Patient Spontanous Breathing and Patient connected to face mask oxygen  Post-op Assessment: Report given to RN and Post -op Vital signs reviewed and stable  Post vital signs: Reviewed and stable  Last Vitals:  Vitals Value Taken Time  BP 94/55 04/16/22 1211  Temp 35.9 C 04/16/22 1210  Pulse 69 04/16/22 1211  Resp 17 04/16/22 1211  SpO2 100 % 04/16/22 1211  Vitals shown include unvalidated device data.  Last Pain:  Vitals:   04/16/22 1210  TempSrc: Temporal  PainSc: Asleep         Complications: No notable events documented.

## 2022-04-16 NOTE — Op Note (Signed)
[facility] Gastroenterology Patient Name: Kathryn George Procedure Date: 04/16/2022 11:38 AM MRN: [id number] Account #: [id number] Date of Birth: 03-30-1976 Admit Type: Outpatient Age: 46 Room: [facility] ENDO ROOM 4 Gender: Female Note Status: Finalized Instrument Name: Jasper Riling [id number] Procedure:             Colonoscopy Indications:           Screening for colorectal malignant neoplasm Providers:             Jonathon Bellows MD, MD Referring MD:          Halina Maidens, MD (Referring MD) Medicines:             Monitored Anesthesia Care Complications:         No immediate complications. Procedure:             Pre-Anesthesia Assessment:                        - Prior to the procedure, a History and Physical was                         performed, and patient medications, allergies and                         sensitivities were reviewed. The patient's tolerance                         of previous anesthesia was reviewed.                        - The risks and benefits of the procedure and the                         sedation options and risks were discussed with the                         patient. All questions were answered and informed                         consent was obtained.                        - ASA Grade Assessment: II - A patient with mild                         systemic disease.                        After obtaining informed consent, the colonoscope was                         passed under direct vision. Throughout the procedure,                         the patient's blood pressure, pulse, and oxygen                         saturations were monitored continuously. The                         Colonoscope was introduced  through the anus and                         advanced to the the cecum, identified by the                         appendiceal orifice. The colonoscopy was performed                         with ease. The patient tolerated the procedure  well.                         The quality of the bowel preparation was excellent. Findings:      The entire examined colon appeared normal. Impression:            - The entire examined colon is normal.                        - No specimens collected. Recommendation:        - Discharge patient to home (with escort).                        - Resume previous diet.                        - Continue present medications.                        - Repeat colonoscopy in 10 years for screening                         purposes. Procedure Code(s):     --- Professional ---                        [phone], Colonoscopy, flexible; diagnostic, including                         collection of specimen(s) by brushing or washing, when                         performed (separate procedure) Diagnosis Code(s):     --- Professional ---                        Z12.11, Encounter for screening for malignant neoplasm                         of colon CPT copyright 2022 American Medical Association. All rights reserved. The codes documented in this report are preliminary and upon coder review may  be revised to meet current compliance requirements. Jonathon Bellows, MD Jonathon Bellows MD, MD 04/16/2022 12:10:00 PM This report has been signed electronically. Number of Addenda: 0 Note Initiated On: 04/16/2022 11:38 AM Scope Withdrawal Time: 0 hours 6 minutes 21 seconds  Total Procedure Duration: 0 hours 8 minutes 29 seconds  Estimated Blood Loss:  Estimated blood loss: none.      [facility]

## 2022-04-16 NOTE — Anesthesia Procedure Notes (Signed)
Date/Time: 04/16/2022 11:56 AM  Performed by: Demetrius Charity, CRNAPre-anesthesia Checklist: Patient identified, Emergency Drugs available, Suction available, Patient being monitored and Timeout performed Patient Re-evaluated:Patient Re-evaluated prior to induction Oxygen Delivery Method: Nasal cannula Induction Type: IV induction Placement Confirmation: CO2 detector and positive ETCO2

## 2022-04-16 NOTE — Anesthesia Postprocedure Evaluation (Signed)
Anesthesia Post Note  Patient: Arrietty Dercole  Procedure(s) Performed: COLONOSCOPY WITH PROPOFOL  Patient location during evaluation: Endoscopy Anesthesia Type: General Level of consciousness: awake and alert Pain management: pain level controlled Vital Signs Assessment: post-procedure vital signs reviewed and stable Respiratory status: spontaneous breathing, nonlabored ventilation, respiratory function stable and patient connected to nasal cannula oxygen Cardiovascular status: blood pressure returned to baseline and stable Postop Assessment: no apparent nausea or vomiting Anesthetic complications: no   No notable events documented.   Last Vitals:  Vitals:   04/16/22 1220 04/16/22 1230  BP: 115/68 123/79  Pulse: 69 69  Resp: (!) 24 18  Temp:    SpO2: 95% 99%    Last Pain:  Vitals:   04/16/22 1230  TempSrc:   PainSc: 0-No pain                 Precious Haws Veverly Larimer

## 2022-04-16 NOTE — H&P (Signed)
Jonathon Bellows, MD [address] [place], [place], [place], [address] [address], [place], [place], [place], [address] Phone: [phone]  Fax: [phone]  Primary Care Physician:  Glean Hess, MD   Pre-Procedure History & Physical: HPI:  Kathryn George is a 46 y.o. female is here for an colonoscopy.   Past Medical History:  Diagnosis Date   Allergy    year round allergies- eyes water. nose runs.    Anxiety 2014   GERD (gastroesophageal reflux disease)    no meds   Headache    migraines   Heart murmur    h/o during pregnancy   Insomnia 2014    Past Surgical History:  Procedure Laterality Date   DILATION AND CURETTAGE OF UTERUS  09/16/2020   Procedure: DILATATION AND CURETTAGE;  Surgeon: Malachy Mood, MD;  Location: [facility] ORS;  Service: Gynecology;;   Amado Coe  09/16/2020   Procedure: VAGINAL MYOMECTOMY;  Surgeon: Malachy Mood, MD;  Location: [facility] ORS;  Service: Gynecology;;    Prior to Admission medications   Medication Sig Start Date End Date Taking? Authorizing Provider  ibuprofen (ADVIL) 800 MG tablet Take 1 tablet (800 mg total) by mouth as needed. 01/14/21   Glean Hess, MD  Insulin Pen Needle (PEN NEEDLES) 31G X 5 MM MISC 1 each by Does not apply route daily at 6 (six) AM. 12/01/21   Glean Hess, MD  Semaglutide-Weight Management (WEGOVY) 1.7 MG/0.75ML SOAJ Inject 1.7 mg into the skin once a week. Patient not taking: Reported on 02/04/2022 01/12/22   Glean Hess, MD    Allergies as of 02/08/2022 - Review Complete 02/08/2022  Allergen Reaction Noted   Shellfish allergy Shortness Of Breath and Swelling 09/10/2020    Family History  Problem Relation Age of Onset   Breast cancer Mother 3   Diabetes Mother    Diabetes Maternal Aunt    Cancer Paternal Aunt    Diabetes Maternal Grandmother    Dementia Maternal Grandmother    Heart disease Maternal Grandfather 82   Breast cancer Paternal Grandmother     Social  History   Socioeconomic History   Marital status: Single    Spouse name: Not on file   Number of children: Not on file   Years of education: Not on file   Highest education level: Not on file  Occupational History   Not on file  Tobacco Use   Smoking status: Former    Passive exposure: Past   Smokeless tobacco: Never  Vaping Use   Vaping Use: Never used  Substance and Sexual Activity   Alcohol use: Yes    Comment: social   Drug use: No   Sexual activity: Yes    Birth control/protection: None  Other Topics Concern   Not on file  Social History Narrative   Not on file   Social Determinants of Health   Financial Resource Strain: Low Risk  (12/01/2021)   Overall Financial Resource Strain (CARDIA)    Difficulty of Paying Living Expenses: Not hard at all  Food Insecurity: No Food Insecurity (12/01/2021)   Hunger Vital Sign    Worried About Running Out of Food in the Last Year: Never true    [place] in the Last Year: Never true  Transportation Needs: No Transportation Needs (12/01/2021)   PRAPARE - Hydrologist (Medical): No    Lack of Transportation (Non-Medical): No  Physical Activity: Not on file  Stress: Not on file  Social Connections: Not on file  Intimate Partner Violence: Not At Risk (12/01/2021)   Humiliation, Afraid, Rape, and Kick questionnaire    Fear of Current or Ex-Partner: No    Emotionally Abused: No    Physically Abused: No    Sexually Abused: No    Review of Systems: See HPI, otherwise negative ROS  Physical Exam: BP (!) 162/86   Pulse 64   Temp (!) 97 F (36.1 C) (Temporal)   Resp 18   Ht '5\' 3"'$  (1.6 m)   Wt 113.6 kg   SpO2 100%   BMI 44.38 kg/m  General:   Alert,  pleasant and cooperative in NAD Head:  Normocephalic and atraumatic. Neck:  Supple; no masses or thyromegaly. Lungs:  Clear throughout to auscultation, normal respiratory effort.    Heart:  +S1, +S2, Regular rate and rhythm, No edema. Abdomen:   Soft, nontender and nondistended. Normal bowel sounds, without guarding, and without rebound.   Neurologic:  Alert and  oriented x4;  grossly normal neurologically.  Impression/Plan: Kathryn George is here for an colonoscopy to be performed for Screening colonoscopy average risk   Risks, benefits, limitations, and alternatives regarding  colonoscopy have been reviewed with the patient.  Questions have been answered.  All parties agreeable.   Jonathon Bellows, MD  04/16/2022, 11:39 AM

## 2022-06-04 ENCOUNTER — Encounter: Payer: Self-pay | Admitting: Internal Medicine

## 2022-06-04 ENCOUNTER — Ambulatory Visit (INDEPENDENT_AMBULATORY_CARE_PROVIDER_SITE_OTHER): Payer: 59 | Admitting: Internal Medicine

## 2022-06-04 VITALS — BP 122/78 | HR 67 | Ht 63.0 in | Wt 259.0 lb

## 2022-06-04 DIAGNOSIS — Z021 Encounter for pre-employment examination: Secondary | ICD-10-CM | POA: Diagnosis not present

## 2022-06-04 DIAGNOSIS — E559 Vitamin D deficiency, unspecified: Secondary | ICD-10-CM | POA: Diagnosis not present

## 2022-06-04 DIAGNOSIS — R7303 Prediabetes: Secondary | ICD-10-CM

## 2022-06-04 DIAGNOSIS — R102 Pelvic and perineal pain: Secondary | ICD-10-CM

## 2022-06-04 DIAGNOSIS — Z6841 Body Mass Index (BMI) 40.0 and over, adult: Secondary | ICD-10-CM | POA: Diagnosis not present

## 2022-06-04 NOTE — Patient Instructions (Signed)
Call after your new insurance is in effect - upload the cards and message me to send in [place].

## 2022-06-04 NOTE — Progress Notes (Signed)
Date:  06/04/2022   Name:  Kathryn George   DOB:  01/06/1976   MRN:  [id number]   Chief Complaint: Pelvic Pain and TB skin test (For work )  HPI Obesity - she was on Saxenda earlier this year but it was not very helpful.  We tried to get [facility] approved but it was not covered by her insurance.  Now she is going to change to a PPO plan which will have medications covered with PA.   This will go into effect on Jan 1. Pelvic pain - she is s/p fibroidectomy and uterine ablation for excessive bleeding.  That has resolved but now she is having more uterine contractions and lower pelvic pain.  She plan to see her GYN in the near future. ABSS form - she has a form to start work at [employer] in [employer].  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  She has no risk for TB so does not need testing.  Lab Results  Component Value Date   NA 137 02/04/2022   K 4.2 02/04/2022   CO2 20 02/04/2022   GLUCOSE 84 02/04/2022   BUN 8 02/04/2022   CREATININE 0.75 02/04/2022   CALCIUM 9.1 02/04/2022   EGFR 98 02/04/2022   GFRNONAA >60 09/12/2020   Lab Results  Component Value Date   CHOL 191 02/04/2022   HDL 53 02/04/2022   LDLCALC 116 (H) 02/04/2022   TRIG 123 02/04/2022   CHOLHDL 3.6 02/04/2022   Lab Results  Component Value Date   TSH 1.170 02/04/2022   Lab Results  Component Value Date   HGBA1C 5.7 (H) 02/04/2022   Lab Results  Component Value Date   WBC 5.0 02/04/2022   HGB 11.9 02/04/2022   HCT 37.3 02/04/2022   MCV 74 (L) 02/04/2022   PLT 297 02/04/2022   Lab Results  Component Value Date   ALT 21 02/04/2022   AST 16 02/04/2022   ALKPHOS 66 02/04/2022   BILITOT 0.3 02/04/2022   Lab Results  Component Value Date   VD25OH 15.8 (L) 02/04/2022     Review of Systems  Constitutional:  Negative for chills, diaphoresis, fatigue, fever and unexpected weight change.  HENT:  Negative for trouble swallowing.   Respiratory:  Negative for chest tightness and shortness of  breath.   Cardiovascular:  Negative for chest pain and palpitations.  Neurological:  Negative for dizziness and headaches.  Psychiatric/Behavioral:  Negative for dysphoric mood and sleep disturbance. The patient is not nervous/anxious.     Patient Active Problem List   Diagnosis Date Noted   Encounter for screening colonoscopy    Prediabetes 12/01/2021   BMI 40.0-44.9, adult ([place]) 12/01/2021   Abnormal uterine bleeding (AUB) 09/16/2020   Intramural, submucous, and subserous leiomyoma of uterus 07/31/2020   Microcytic anemia 07/31/2020   Vitamin D deficiency 10/15/2019   Migraine without aura and without status migrainosus, not intractable 10/15/2019   Sebaceous cyst of skin of left breast 08/25/2018   Hyperlipidemia, mixed 04/17/2017   Menorrhagia with regular cycle 10/04/2016   Environmental and seasonal allergies 10/04/2016    Allergies  Allergen Reactions   Shellfish Allergy Shortness Of Breath and Swelling    CRAB AND LOBSTER    Past Surgical History:  Procedure Laterality Date   COLONOSCOPY WITH PROPOFOL N/A 04/16/2022   Procedure: COLONOSCOPY WITH PROPOFOL;  Surgeon: Jonathon Bellows, MD;  Location: [facility] ENDOSCOPY;  Service: Gastroenterology;  Laterality: N/A;   DILATION AND CURETTAGE OF  UTERUS  09/16/2020   Procedure: DILATATION AND CURETTAGE;  Surgeon: Malachy Mood, MD;  Location: [facility] ORS;  Service: Gynecology;;   Amado Coe  09/16/2020   Procedure: VAGINAL MYOMECTOMY;  Surgeon: Malachy Mood, MD;  Location: [facility] ORS;  Service: Gynecology;;    Social History   Tobacco Use   Smoking status: Former    Passive exposure: Past   Smokeless tobacco: Never  Vaping Use   Vaping Use: Never used  Substance Use Topics   Alcohol use: Yes    Comment: social   Drug use: No     Medication list has been reviewed and updated.  Current Meds  Medication Sig   ibuprofen (ADVIL) 800 MG tablet Take 1 tablet (800 mg total) by mouth as needed.   Insulin Pen Needle (PEN  NEEDLES) 31G X 5 MM MISC 1 each by Does not apply route daily at 6 (six) AM.       06/04/2022    3:51 PM 02/04/2022    9:02 AM 01/12/2022    9:33 AM 12/01/2021   10:52 AM  GAD 7 : Generalized Anxiety Score  Nervous, Anxious, on Edge 0 1 0 0  Control/stop worrying 0 1 0 0  Worry too much - different things 0 _0 Trouble relaxing 0 2 1 0  Restless 0 _1 Easily annoyed or irritable 0 2 0 0  Afraid - awful might happen 0 0 0 0  Total GAD 7 Score 0 _2 Anxiety Difficulty Not difficult at all Somewhat difficult Not difficult at all Not difficult at all       06/04/2022    3:50 PM 02/04/2022    9:01 AM 01/12/2022    9:32 AM  Depression screen PHQ 2/9  Decreased Interest 0 1 0  Down, Depressed, Hopeless 0 1 0  PHQ - 2 Score 0 2 0  Altered sleeping 0 2 0  Tired, decreased energy 0 0 1  Change in appetite 0 2 0  Feeling bad or failure about yourself  0 0 0  Trouble concentrating 0 1 0  Moving slowly or fidgety/restless 0 1 0  Suicidal thoughts 0 0 0  PHQ-9 Score 0 8 1  Difficult doing work/chores Not difficult at all Somewhat difficult Not difficult at all    BP Readings from Last 3 Encounters:  06/04/22 122/78  04/16/22 123/79  02/04/22 128/72    Physical Exam Vitals and nursing note reviewed.  Constitutional:      General: She is not in acute distress.    Appearance: Normal appearance. She is well-developed.  HENT:     Head: Normocephalic and atraumatic.  Cardiovascular:     Rate and Rhythm: Normal rate and regular rhythm.  Pulmonary:     Effort: Pulmonary effort is normal. No respiratory distress.     Breath sounds: No wheezing or rhonchi.  Musculoskeletal:     Cervical back: Normal range of motion.     Right lower leg: No edema.     Left lower leg: No edema.  Lymphadenopathy:     Cervical: No cervical adenopathy.  Skin:    General: Skin is warm and dry.     Findings: No rash.  Neurological:     Mental Status: She is alert and oriented to person,  place, and time.  Psychiatric:        Mood and Affect: Mood normal.        Behavior: Behavior normal.  Wt Readings from Last 3 Encounters:  06/04/22 259 lb (117.5 kg)  04/16/22 250 lb 8.1 oz (113.6 kg)  02/04/22 255 lb (115.7 kg)    BP 122/78   Pulse 67   Ht _0  (1.6 m)   Wt 259 lb (117.5 kg)   SpO2 94%   BMI 45.88 kg/m   Assessment and Plan: 1. Prediabetes Would benefit from weight loss She wants to go on [facility] after Jan 1. She will send her new insurance info and a message for the Rx to be sent in.  2. BMI 45.0-49.9, adult ([place]) Continue diet and exercise efforts  3. Pre-employment examination Form completed. TB testing is not indicated  4. Vitamin D deficiency Low levels last year Will need to start a daily supplement  5. Pelvic pain Recommend follow up with GYN   Partially dictated using Editor, commissioning. Any errors are unintentional.  Halina Maidens, MD [facility]  06/04/2022

## 2022-06-07 ENCOUNTER — Ambulatory Visit (INDEPENDENT_AMBULATORY_CARE_PROVIDER_SITE_OTHER): Payer: 59 | Admitting: Obstetrics & Gynecology

## 2022-06-07 ENCOUNTER — Encounter: Payer: Self-pay | Admitting: Obstetrics & Gynecology

## 2022-06-07 VITALS — BP 153/102 | HR 69 | Ht 63.0 in | Wt 264.0 lb

## 2022-06-07 DIAGNOSIS — R102 Pelvic and perineal pain: Secondary | ICD-10-CM | POA: Diagnosis not present

## 2022-06-07 DIAGNOSIS — D219 Benign neoplasm of connective and other soft tissue, unspecified: Secondary | ICD-10-CM | POA: Diagnosis not present

## 2022-06-07 NOTE — Progress Notes (Signed)
   Established Patient Office Visit  Subjective   Patient ID: Kathryn George, female    DOB: 1976-01-12  Age: 46 y.o. MRN: [id number]  Chief Complaint  Patient presents with   Pelvic Pressure    HPI    46 yo single P3 here today with a week's h/o "pelvic pressure". It resolved several days ago. She has no bleeding since 6 months ago. She has a h/o fibroids and had a vaginal myomectomy for a prolapsing fibroid 08/2020. She says that she then had a [place].   She has no other complaints today.  Her pap smear was normal 02/04/2022. Objective:     BP (!) 153/102   Pulse 69   Ht '5\' 3"'$  (1.6 m)   Wt 264 lb (119.7 kg)   BMI 46.77 kg/m    Physical Exam Well nourished, well hydrated Black female, no apparent distress   EG- normal  Vagina- mild cystocele and rectocele c/w having 3 babies vaginallly Cervix- normal appearance Bimanual- 10 week size uterus, no prolapse  Assessment & Plan:  Pelvic pressure- now resolved Reassurance given  Problem List Items Addressed This Visit   None   No follow-ups on file.    Emily Filbert, MD

## 2022-06-21 ENCOUNTER — Encounter: Payer: Self-pay | Admitting: Internal Medicine

## 2022-06-22 ENCOUNTER — Other Ambulatory Visit: Payer: Self-pay

## 2022-06-22 DIAGNOSIS — Z6841 Body Mass Index (BMI) 40.0 and over, adult: Secondary | ICD-10-CM

## 2022-06-22 MED ORDER — WEGOVY 0.25 MG/0.5ML ~~LOC~~ SOAJ: 0.2500 mg | SUBCUTANEOUS | 0 refills | Status: DC

## 2022-06-23 ENCOUNTER — Telehealth: Payer: Self-pay

## 2022-06-23 NOTE — Telephone Encounter (Signed)
PA completed waiting on insurance approval.  Key: [id number]  KP

## 2022-06-28 NOTE — Telephone Encounter (Signed)
Sent in office notes with BMI on 06/26/2021. Waiting on approval from insurance.  KP

## 2022-06-30 NOTE — Telephone Encounter (Signed)
Approved  06/30/2022-01/29/2023.  KP

## 2022-07-13 NOTE — Telephone Encounter (Signed)
Please review.  KP

## 2022-07-14 ENCOUNTER — Other Ambulatory Visit: Payer: Self-pay | Admitting: Internal Medicine

## 2022-07-14 DIAGNOSIS — Z6841 Body Mass Index (BMI) 40.0 and over, adult: Secondary | ICD-10-CM

## 2022-07-14 MED ORDER — SEMAGLUTIDE-WEIGHT MANAGEMENT 1.7 MG/0.75ML ~~LOC~~ SOAJ: 1.7000 mg | SUBCUTANEOUS | 0 refills | Status: DC

## 2022-08-10 ENCOUNTER — Ambulatory Visit: Payer: 59 | Admitting: Internal Medicine

## 2022-08-10 ENCOUNTER — Encounter: Payer: Self-pay | Admitting: Internal Medicine

## 2022-08-10 VITALS — BP 120/78 | HR 73 | Ht 63.0 in | Wt 249.0 lb

## 2022-08-10 DIAGNOSIS — Z6841 Body Mass Index (BMI) 40.0 and over, adult: Secondary | ICD-10-CM

## 2022-08-10 DIAGNOSIS — R110 Nausea: Secondary | ICD-10-CM

## 2022-08-10 MED ORDER — SEMAGLUTIDE-WEIGHT MANAGEMENT 1.7 MG/0.75ML ~~LOC~~ SOAJ: 1.7000 mg | SUBCUTANEOUS | 1 refills | Status: DC

## 2022-08-10 MED ORDER — ONDANSETRON HCL 4 MG PO TABS: 4.0000 mg | ORAL_TABLET | Freq: Three times a day (TID) | ORAL | 0 refills | Status: DC | PRN

## 2022-08-10 NOTE — Assessment & Plan Note (Signed)
Doing well on Wegovy 1.7 mg Some mild nausea but only vomited the first day/first dose Will continue same dose Zofran to use PRN

## 2022-08-10 NOTE — Progress Notes (Signed)
Date:  08/10/2022   Name:  Kathryn George   DOB:  August 09, 1975   MRN:  [id number]   Chief Complaint: Weight Check Weight management - pt is here for weight management follow up.  Started on Wegovy 1.7 mg on 07/14/22.  Doing well without side effects.  Starting weight 259 lbs.   Today's weight 249 lbs.  Current diet is portion control and current exercise routine is walking.  HPI  Lab Results  Component Value Date   NA 137 02/04/2022   K 4.2 02/04/2022   CO2 20 02/04/2022   GLUCOSE 84 02/04/2022   BUN 8 02/04/2022   CREATININE 0.75 02/04/2022   CALCIUM 9.1 02/04/2022   EGFR 98 02/04/2022   GFRNONAA >60 09/12/2020   Lab Results  Component Value Date   CHOL 191 02/04/2022   HDL 53 02/04/2022   LDLCALC 116 (H) 02/04/2022   TRIG 123 02/04/2022   CHOLHDL 3.6 02/04/2022   Lab Results  Component Value Date   TSH 1.170 02/04/2022   Lab Results  Component Value Date   HGBA1C 5.7 (H) 02/04/2022   Lab Results  Component Value Date   WBC 5.0 02/04/2022   HGB 11.9 02/04/2022   HCT 37.3 02/04/2022   MCV 74 (L) 02/04/2022   PLT 297 02/04/2022   Lab Results  Component Value Date   ALT 21 02/04/2022   AST 16 02/04/2022   ALKPHOS 66 02/04/2022   BILITOT 0.3 02/04/2022   Lab Results  Component Value Date   VD25OH 15.8 (L) 02/04/2022     Review of Systems  Constitutional:  Negative for fatigue and unexpected weight change.  HENT:  Negative for nosebleeds.   Eyes:  Negative for visual disturbance.  Respiratory:  Negative for cough, chest tightness, shortness of breath and wheezing.   Cardiovascular:  Negative for chest pain, palpitations and leg swelling.  Gastrointestinal:  Positive for nausea. Negative for abdominal pain, constipation and diarrhea.  Neurological:  Negative for dizziness, weakness, light-headedness and headaches.    Patient Active Problem List   Diagnosis Date Noted   Encounter for screening colonoscopy    Prediabetes 12/01/2021   BMI  40.0-44.9, adult ([place]) 12/01/2021   Abnormal uterine bleeding (AUB) 09/16/2020   Intramural, submucous, and subserous leiomyoma of uterus 07/31/2020   Microcytic anemia 07/31/2020   Vitamin D deficiency 10/15/2019   Migraine without aura and without status migrainosus, not intractable 10/15/2019   Sebaceous cyst of skin of left breast 08/25/2018   Hyperlipidemia, mixed 04/17/2017   Menorrhagia with regular cycle 10/04/2016   Environmental and seasonal allergies 10/04/2016    Allergies  Allergen Reactions   Shellfish Allergy Shortness Of Breath and Swelling    CRAB AND LOBSTER    Past Surgical History:  Procedure Laterality Date   COLONOSCOPY WITH PROPOFOL N/A 04/16/2022   Procedure: COLONOSCOPY WITH PROPOFOL;  Surgeon: Jonathon Bellows, MD;  Location: [facility] ENDOSCOPY;  Service: Gastroenterology;  Laterality: N/A;   DILATION AND CURETTAGE OF UTERUS  09/16/2020   Procedure: DILATATION AND CURETTAGE;  Surgeon: Malachy Mood, MD;  Location: [facility] ORS;  Service: Gynecology;;   Amado Coe  09/16/2020   Procedure: VAGINAL MYOMECTOMY;  Surgeon: Malachy Mood, MD;  Location: [facility] ORS;  Service: Gynecology;;    Social History   Tobacco Use   Smoking status: Former    Passive exposure: Past   Smokeless tobacco: Never  Vaping Use   Vaping Use: Never used  Substance Use Topics   Alcohol use: Yes  Comment: social   Drug use: No     Medication list has been reviewed and updated.  Current Meds  Medication Sig   ondansetron (ZOFRAN) 4 MG tablet Take 1 tablet (4 mg total) by mouth every 8 (eight) hours as needed for nausea or vomiting.   [DISCONTINUED] Semaglutide-Weight Management 1.7 MG/0.75ML SOAJ Inject 1.7 mg into the skin once a week.       08/10/2022    1:51 PM 06/04/2022    3:51 PM 02/04/2022    9:02 AM 01/12/2022    9:33 AM  GAD 7 : Generalized Anxiety Score  Nervous, Anxious, on Edge 0 0 1 0  Control/stop worrying 0 0 1 0  Worry too much - different things 1 0 2 2   Trouble relaxing 0 0 2 1  Restless 0 0 2 1  Easily annoyed or irritable 0 0 2 0  Afraid - awful might happen 0 0 0 0  Total GAD 7 Score 1 0 10 4  Anxiety Difficulty Not difficult at all Not difficult at all Somewhat difficult Not difficult at all       08/10/2022    1:51 PM 06/04/2022    3:50 PM 02/04/2022    9:01 AM  Depression screen PHQ 2/9  Decreased Interest 0 0 1  Down, Depressed, Hopeless 0 0 1  PHQ - 2 Score 0 0 2  Altered sleeping 1 0 2  Tired, decreased energy 1 0 0  Change in appetite 0 0 2  Feeling bad or failure about yourself  0 0 0  Trouble concentrating 0 0 1  Moving slowly or fidgety/restless 0 0 1  Suicidal thoughts 0 0 0  PHQ-9 Score 2 0 8  Difficult doing work/chores Not difficult at all Not difficult at all Somewhat difficult    BP Readings from Last 3 Encounters:  08/10/22 120/78  06/07/22 (!) 153/102  06/04/22 122/78    Physical Exam Vitals and nursing note reviewed.  Constitutional:      General: She is not in acute distress.    Appearance: She is well-developed.  HENT:     Head: Normocephalic and atraumatic.  Pulmonary:     Effort: Pulmonary effort is normal. No respiratory distress.  Skin:    General: Skin is warm and dry.     Findings: No rash.  Neurological:     Mental Status: She is alert and oriented to person, place, and time.  Psychiatric:        Mood and Affect: Mood normal.        Behavior: Behavior normal.     Wt Readings from Last 3 Encounters:  08/10/22 249 lb (112.9 kg)  06/07/22 264 lb (119.7 kg)  06/04/22 259 lb (117.5 kg)    BP 120/78   Pulse 73   Ht 5' 3"$  (1.6 m)   Wt 249 lb (112.9 kg)   SpO2 99%   BMI 44.11 kg/m   Assessment and Plan: Problem List Items Addressed This Visit       Other   BMI 40.0-44.9, adult ([facility]) (Chronic)    Doing well on Wegovy 1.7 mg Some mild nausea but only vomited the first day/first dose Will continue same dose Zofran to use PRN      Relevant Medications    Semaglutide-Weight Management 1.7 MG/0.75ML SOAJ   Other Visit Diagnoses     Nausea    -  Primary   Relevant Medications   ondansetron (ZOFRAN) 4 MG tablet  Partially dictated using Editor, commissioning. Any errors are unintentional.  Halina Maidens, MD [facility]  08/10/2022

## 2022-10-05 ENCOUNTER — Encounter: Payer: Self-pay | Admitting: Internal Medicine

## 2022-10-05 ENCOUNTER — Ambulatory Visit: Payer: 59 | Admitting: Internal Medicine

## 2022-10-05 VITALS — BP 124/78 | HR 51 | Ht 63.0 in | Wt 240.0 lb

## 2022-10-05 DIAGNOSIS — R7303 Prediabetes: Secondary | ICD-10-CM

## 2022-10-05 DIAGNOSIS — Z6841 Body Mass Index (BMI) 40.0 and over, adult: Secondary | ICD-10-CM

## 2022-10-05 MED ORDER — WEGOVY 2.4 MG/0.75ML ~~LOC~~ SOAJ: 2.4000 mg | SUBCUTANEOUS | 1 refills | Status: DC

## 2022-10-05 NOTE — Progress Notes (Addendum)
Date:  10/05/2022   Name:  Kathryn George   DOB:  1976/05/01   MRN:  [id number]   Chief Complaint: Weight Check (Wants to discuss increasing dose of WEGOVY.)  Diabetes She presents for her follow-up diabetic visit. Diabetes type: prediabetes. Her disease course has been improving. Pertinent negatives for diabetes include no chest pain and no fatigue. Current diabetic treatment includes diet (and weight loss medication). Her weight is decreasing steadily.   Weight management - pt is here for weight management follow up.  Started on [place] on 1/24.  Doing well without side effects.  Starting weight 259 lbs.   Today's weight 240 lbs.  Current diet is low carb and current exercise routine is walking.  Lab Results  Component Value Date   NA 137 02/04/2022   K 4.2 02/04/2022   CO2 20 02/04/2022   GLUCOSE 84 02/04/2022   BUN 8 02/04/2022   CREATININE 0.75 02/04/2022   CALCIUM 9.1 02/04/2022   EGFR 98 02/04/2022   GFRNONAA >60 09/12/2020   Lab Results  Component Value Date   CHOL 191 02/04/2022   HDL 53 02/04/2022   LDLCALC 116 (H) 02/04/2022   TRIG 123 02/04/2022   CHOLHDL 3.6 02/04/2022   Lab Results  Component Value Date   TSH 1.170 02/04/2022   Lab Results  Component Value Date   HGBA1C 5.7 (H) 02/04/2022   Lab Results  Component Value Date   WBC 5.0 02/04/2022   HGB 11.9 02/04/2022   HCT 37.3 02/04/2022   MCV 74 (L) 02/04/2022   PLT 297 02/04/2022   Lab Results  Component Value Date   ALT 21 02/04/2022   AST 16 02/04/2022   ALKPHOS 66 02/04/2022   BILITOT 0.3 02/04/2022   Lab Results  Component Value Date   VD25OH 15.8 (L) 02/04/2022     Review of Systems  Constitutional:  Negative for chills, fatigue and fever.  Respiratory:  Negative for chest tightness and shortness of breath.   Cardiovascular:  Negative for chest pain, palpitations and leg swelling.  Gastrointestinal:  Negative for abdominal distention, constipation and diarrhea.   Psychiatric/Behavioral:  Negative for sleep disturbance.     Patient Active Problem List   Diagnosis Date Noted   Encounter for screening colonoscopy    Prediabetes 12/01/2021   BMI 40.0-44.9, adult 12/01/2021   Abnormal uterine bleeding (AUB) 09/16/2020   Intramural, submucous, and subserous leiomyoma of uterus 07/31/2020   Microcytic anemia 07/31/2020   Vitamin D deficiency 10/15/2019   Migraine without aura and without status migrainosus, not intractable 10/15/2019   Sebaceous cyst of skin of left breast 08/25/2018   Hyperlipidemia, mixed 04/17/2017   Menorrhagia with regular cycle 10/04/2016   Environmental and seasonal allergies 10/04/2016    Allergies  Allergen Reactions   Shellfish Allergy Shortness Of Breath and Swelling    CRAB AND LOBSTER    Past Surgical History:  Procedure Laterality Date   COLONOSCOPY WITH PROPOFOL N/A 04/16/2022   Procedure: COLONOSCOPY WITH PROPOFOL;  Surgeon: Wyline Mood, MD;  Location: [facility] ENDOSCOPY;  Service: Gastroenterology;  Laterality: N/A;   DILATION AND CURETTAGE OF UTERUS  09/16/2020   Procedure: DILATATION AND CURETTAGE;  Surgeon: Vena Austria, MD;  Location: [facility] ORS;  Service: Gynecology;;   [name]  09/16/2020   Procedure: VAGINAL MYOMECTOMY;  Surgeon: Vena Austria, MD;  Location: [facility] ORS;  Service: Gynecology;;    Social History   Tobacco Use   Smoking status: Former    Passive exposure: Past  Smokeless tobacco: Never  Vaping Use   Vaping Use: Never used  Substance Use Topics   Alcohol use: Yes    Comment: social   Drug use: No     Medication list has been reviewed and updated.  Current Meds  Medication Sig   ondansetron (ZOFRAN) 4 MG tablet Take 1 tablet (4 mg total) by mouth every 8 (eight) hours as needed for nausea or vomiting.   Semaglutide-Weight Management (WEGOVY) 2.4 MG/0.75ML SOAJ Inject 2.4 mg into the skin once a week.   [DISCONTINUED] Semaglutide-Weight Management 1.7 MG/0.75ML SOAJ  Inject 1.7 mg into the skin once a week.       10/05/2022    2:02 PM 08/10/2022    1:51 PM 06/04/2022    3:51 PM 02/04/2022    9:02 AM  GAD 7 : Generalized Anxiety Score  Nervous, Anxious, on Edge 0 0 0 1  Control/stop worrying 0 0 0 1  Worry too much - different things 2 1 0 2  Trouble relaxing 1 0 0 2  Restless 1 0 0 2  Easily annoyed or irritable 1 0 0 2  Afraid - awful might happen 0 0 0 0  Total GAD 7 Score 5 1 0 10  Anxiety Difficulty Not difficult at all Not difficult at all Not difficult at all Somewhat difficult       10/05/2022    2:01 PM 08/10/2022    1:51 PM 06/04/2022    3:50 PM  Depression screen PHQ 2/9  Decreased Interest 0 0 0  Down, Depressed, Hopeless 0 0 0  PHQ - 2 Score 0 0 0  Altered sleeping 1 1 0  Tired, decreased energy 1 1 0  Change in appetite 0 0 0  Feeling bad or failure about yourself  0 0 0  Trouble concentrating 0 0 0  Moving slowly or fidgety/restless 0 0 0  Suicidal thoughts 0 0 0  PHQ-9 Score 2 2 0  Difficult doing work/chores Not difficult at all Not difficult at all Not difficult at all    BP Readings from Last 3 Encounters:  10/05/22 124/78  08/10/22 120/78  06/07/22 (!) 153/102    Physical Exam Vitals and nursing note reviewed.  Constitutional:      General: She is not in acute distress.    Appearance: She is well-developed. She is obese.  HENT:     Head: Normocephalic and atraumatic.  Cardiovascular:     Rate and Rhythm: Normal rate and regular rhythm.  Pulmonary:     Effort: Pulmonary effort is normal. No respiratory distress.     Breath sounds: No wheezing or rhonchi.  Abdominal:     General: Abdomen is flat.     Palpations: Abdomen is soft.     Tenderness: There is no abdominal tenderness.  Musculoskeletal:     Cervical back: Normal range of motion.     Right lower leg: No edema.     Left lower leg: No edema.  Lymphadenopathy:     Cervical: No cervical adenopathy.  Skin:    General: Skin is warm and dry.      Findings: No rash.  Neurological:     Mental Status: She is alert and oriented to person, place, and time.  Psychiatric:        Mood and Affect: Mood normal.        Behavior: Behavior normal.     Wt Readings from Last 3 Encounters:  10/05/22 240 lb (108.9 kg)  08/10/22 249 lb (112.9 kg)  06/07/22 264 lb (119.7 kg)    BP 124/78   Pulse (!) 51   Ht  (1.6 m)   Wt 240 lb (108.9 kg)   SpO2 98%   BMI 42.51 kg/m   Assessment and Plan:  Problem List Items Addressed This Visit       Other   BMI 40.0-44.9, adult (Chronic)    Losing weight steadily on Wegovy 1.7 mg weekly Minimal side effects. Total loss to date 19 lbs. Will increase to 2.4 mg per week Schedule CPX for 4 months - labs and med refills at that time.      Relevant Medications   Semaglutide-Weight Management (WEGOVY) 2.4 MG/0.75ML SOAJ   Prediabetes - Primary (Chronic)    Should continue to improve with ongoing weight loss.       Return in about 4 months (around 02/04/2023) for CPX.   Partially dictated using Dragon software, any errors are not intentional.  Reubin Milan, MD [facility] [place], [place]

## 2022-10-05 NOTE — Assessment & Plan Note (Addendum)
Losing weight steadily on Wegovy 1.7 mg weekly Minimal side effects. Total loss to date 19 lbs. Will increase to 2.4 mg per week Schedule CPX for 4 months - labs and med refills at that time.

## 2022-10-08 NOTE — Assessment & Plan Note (Signed)
Should continue to improve with ongoing weight loss.

## 2022-12-06 ENCOUNTER — Other Ambulatory Visit: Payer: Self-pay | Admitting: Internal Medicine

## 2022-12-06 DIAGNOSIS — Z6841 Body Mass Index (BMI) 40.0 and over, adult: Secondary | ICD-10-CM

## 2023-01-19 ENCOUNTER — Ambulatory Visit: Payer: 59 | Admitting: Internal Medicine

## 2023-01-19 ENCOUNTER — Encounter: Payer: Self-pay | Admitting: Internal Medicine

## 2023-01-19 VITALS — BP 122/84 | HR 70 | Ht 63.0 in | Wt 229.0 lb

## 2023-01-19 DIAGNOSIS — R7303 Prediabetes: Secondary | ICD-10-CM

## 2023-01-19 DIAGNOSIS — Z6841 Body Mass Index (BMI) 40.0 and over, adult: Secondary | ICD-10-CM | POA: Diagnosis not present

## 2023-01-19 DIAGNOSIS — Z1231 Encounter for screening mammogram for malignant neoplasm of breast: Secondary | ICD-10-CM | POA: Diagnosis not present

## 2023-01-19 DIAGNOSIS — G8929 Other chronic pain: Secondary | ICD-10-CM

## 2023-01-19 DIAGNOSIS — E782 Mixed hyperlipidemia: Secondary | ICD-10-CM | POA: Diagnosis not present

## 2023-01-19 DIAGNOSIS — R21 Rash and other nonspecific skin eruption: Secondary | ICD-10-CM

## 2023-01-19 DIAGNOSIS — M546 Pain in thoracic spine: Secondary | ICD-10-CM

## 2023-01-19 MED ORDER — WEGOVY 2.4 MG/0.75ML ~~LOC~~ SOAJ: 2.4000 mg | SUBCUTANEOUS | 3 refills | Status: DC

## 2023-01-19 NOTE — Progress Notes (Signed)
Date:  01/19/2023   Name:  Kathryn George   DOB:  1975-08-15   MRN:  [id number]   Chief Complaint: Prediabetes  Back Pain This is a chronic problem. The pain is present in the thoracic spine. The pain does not radiate. The pain is moderate. Pertinent negatives include no abdominal pain, chest pain, headaches or weakness.  Diabetes She presents for her follow-up diabetic visit. Diabetes type: prediabetes. Her disease course has been improving. Pertinent negatives for hypoglycemia include no dizziness, headaches or nervousness/anxiousness. Pertinent negatives for diabetes include no chest pain, no fatigue and no weakness.  Rash This is a recurrent problem. Episode onset: years ago. The affected locations include the face. The rash is characterized by peeling, redness and scaling. Pertinent negatives include no cough, diarrhea, fatigue or shortness of breath. Treatments tried: topical meds from Derm years ago - not working any longer.    Lab Results  Component Value Date   NA 137 02/04/2022   K 4.2 02/04/2022   CO2 20 02/04/2022   GLUCOSE 84 02/04/2022   BUN 8 02/04/2022   CREATININE 0.75 02/04/2022   CALCIUM 9.1 02/04/2022   EGFR 98 02/04/2022   GFRNONAA >60 09/12/2020   Lab Results  Component Value Date   CHOL 191 02/04/2022   HDL 53 02/04/2022   LDLCALC 116 (H) 02/04/2022   TRIG 123 02/04/2022   CHOLHDL 3.6 02/04/2022   Lab Results  Component Value Date   TSH 1.170 02/04/2022   Lab Results  Component Value Date   HGBA1C 5.7 (H) 02/04/2022   Lab Results  Component Value Date   WBC 5.0 02/04/2022   HGB 11.9 02/04/2022   HCT 37.3 02/04/2022   MCV 74 (L) 02/04/2022   PLT 297 02/04/2022   Lab Results  Component Value Date   ALT 21 02/04/2022   AST 16 02/04/2022   ALKPHOS 66 02/04/2022   BILITOT 0.3 02/04/2022   Lab Results  Component Value Date   VD25OH 15.8 (L) 02/04/2022     Review of Systems  Constitutional:  Positive for unexpected weight  change. Negative for chills and fatigue.  HENT:  Negative for nosebleeds.   Eyes:  Negative for visual disturbance.  Respiratory:  Negative for cough, chest tightness, shortness of breath and wheezing.   Cardiovascular:  Negative for chest pain, palpitations and leg swelling.  Gastrointestinal:  Negative for abdominal pain, constipation and diarrhea.  Musculoskeletal:  Positive for back pain.  Skin:  Positive for rash.  Neurological:  Negative for dizziness, weakness, light-headedness and headaches.  Psychiatric/Behavioral:  Negative for dysphoric mood and sleep disturbance. The patient is not nervous/anxious.     Patient Active Problem List   Diagnosis Date Noted   Encounter for screening colonoscopy    Prediabetes 12/01/2021   BMI 40.0-44.9, adult (HCC) 12/01/2021   Abnormal uterine bleeding (AUB) 09/16/2020   Intramural, submucous, and subserous leiomyoma of uterus 07/31/2020   Microcytic anemia 07/31/2020   Vitamin D deficiency 10/15/2019   Migraine without aura and without status migrainosus, not intractable 10/15/2019   Sebaceous cyst of skin of left breast 08/25/2018   Hyperlipidemia, mixed 04/17/2017   Menorrhagia with regular cycle 10/04/2016   Environmental and seasonal allergies 10/04/2016    Allergies  Allergen Reactions   Shellfish Allergy Shortness Of Breath and Swelling    CRAB AND LOBSTER    Past Surgical History:  Procedure Laterality Date   COLONOSCOPY WITH PROPOFOL N/A 04/16/2022   Procedure: COLONOSCOPY WITH PROPOFOL;  Surgeon:  Wyline Mood, MD;  Location: [facility] ENDOSCOPY;  Service: Gastroenterology;  Laterality: N/A;   DILATION AND CURETTAGE OF UTERUS  09/16/2020   Procedure: DILATATION AND CURETTAGE;  Surgeon: Vena Austria, MD;  Location: [facility] ORS;  Service: Gynecology;;   [name]  09/16/2020   Procedure: VAGINAL MYOMECTOMY;  Surgeon: Vena Austria, MD;  Location: [facility] ORS;  Service: Gynecology;;    Social History   Tobacco Use   Smoking  status: Former    Passive exposure: Past   Smokeless tobacco: Never  Vaping Use   Vaping status: Never Used  Substance Use Topics   Alcohol use: Yes    Comment: social   Drug use: No     Medication list has been reviewed and updated.  Current Meds  Medication Sig   [DISCONTINUED] ondansetron (ZOFRAN) 4 MG tablet Take 1 tablet (4 mg total) by mouth every 8 (eight) hours as needed for nausea or vomiting.   [DISCONTINUED] Semaglutide-Weight Management (WEGOVY) 2.4 MG/0.75ML SOAJ INJECT 2.4 MG INTO THE SKIN ONCE A WEEK.       01/19/2023    1:26 PM 10/05/2022    2:02 PM 08/10/2022    1:51 PM 06/04/2022    3:51 PM  GAD 7 : Generalized Anxiety Score  Nervous, Anxious, on Edge 0 0 0 0  Control/stop worrying 0 0 0 0  Worry too much - different things 1 2 1  0  Trouble relaxing 1 1 0 0  Restless 0 1 0 0  Easily annoyed or irritable 0 1 0 0  Afraid - awful might happen 0 0 0 0  Total GAD 7 Score 2 5 1  0  Anxiety Difficulty Not difficult at all Not difficult at all Not difficult at all Not difficult at all       01/19/2023    1:26 PM 10/05/2022    2:01 PM 08/10/2022    1:51 PM  Depression screen PHQ 2/9  Decreased Interest 0 0 0  Down, Depressed, Hopeless 0 0 0  PHQ - 2 Score 0 0 0  Altered sleeping 1 1 1   Tired, decreased energy 1 1 1   Change in appetite 0 0 0  Feeling bad or failure about yourself  0 0 0  Trouble concentrating 0 0 0  Moving slowly or fidgety/restless 0 0 0  Suicidal thoughts 0 0 0  PHQ-9 Score 2 2 2   Difficult doing work/chores Not difficult at all Not difficult at all Not difficult at all    BP Readings from Last 3 Encounters:  01/19/23 122/84  10/05/22 124/78  08/10/22 120/78    Physical Exam Vitals and nursing note reviewed.  Constitutional:      General: She is not in acute distress.    Appearance: She is well-developed.  HENT:     Head: Normocephalic and atraumatic.  Neck:     Vascular: No carotid bruit.  Cardiovascular:     Rate and  Rhythm: Normal rate and regular rhythm.  Pulmonary:     Effort: Pulmonary effort is normal. No respiratory distress.     Breath sounds: No wheezing or rhonchi.  Musculoskeletal:     Cervical back: Normal range of motion.  Lymphadenopathy:     Cervical: No cervical adenopathy.  Skin:    General: Skin is warm and dry.     Findings: Rash present. Rash is scaling.     Comments: And hypopigmentation around chin and lips  Neurological:     Mental Status: She is alert and oriented to  person, place, and time.  Psychiatric:        Mood and Affect: Mood normal.        Behavior: Behavior normal.     Wt Readings from Last 3 Encounters:  01/19/23 229 lb (103.9 kg)  10/05/22 240 lb (108.9 kg)  08/10/22 249 lb (112.9 kg)    BP 122/84   Pulse 70   Ht 5\' 3"  (1.6 m)   Wt 229 lb (103.9 kg)   SpO2 98%   BMI 40.57 kg/m   Assessment and Plan:  Problem List Items Addressed This Visit       Unprioritized   Prediabetes - Primary (Chronic)    Being managed with weight loss on Wegovy Lab Results  Component Value Date   HGBA1C 5.7 (H) 02/04/2022         Relevant Orders   CBC with Differential/Platelet   Comprehensive metabolic panel   Hyperlipidemia, mixed (Chronic)    Managed with diet and weight loss.      Relevant Orders   Lipid panel   TSH   BMI 40.0-44.9, adult (HCC) (Chronic)    Starting weight 259 lbs. Today 229 lbs for a total loss of 30 lbs. Doing well on Wegovy 2.4 mg weekly.      Relevant Medications   Semaglutide-Weight Management (WEGOVY) 2.4 MG/0.75ML SOAJ   Other Visit Diagnoses     Encounter for screening mammogram for breast cancer       schedule at [facility]   Chronic midline thoracic back pain       use heat or ice, topical rubs and tylenol.   Facial rash       Relevant Orders   Ambulatory referral to Dermatology       Return in about 4 months (around 05/21/2023) for weight check.    Reubin Milan, MD [facility] [place]

## 2023-01-19 NOTE — Assessment & Plan Note (Signed)
Starting weight 259 lbs. Today 229 lbs for a total loss of 30 lbs. Doing well on Wegovy 2.4 mg weekly.

## 2023-01-19 NOTE — Assessment & Plan Note (Signed)
Managed with diet and weight loss

## 2023-01-19 NOTE — Assessment & Plan Note (Signed)
Being managed with weight loss on [facility] Lab Results  Component Value Date   HGBA1C 5.7 (H) 02/04/2022

## 2023-01-20 ENCOUNTER — Encounter: Payer: 59 | Admitting: Internal Medicine

## 2023-03-08 ENCOUNTER — Encounter: Payer: Self-pay | Admitting: Internal Medicine

## 2023-03-08 ENCOUNTER — Other Ambulatory Visit: Payer: Self-pay | Admitting: Internal Medicine

## 2023-03-08 DIAGNOSIS — Z6841 Body Mass Index (BMI) 40.0 and over, adult: Secondary | ICD-10-CM

## 2023-03-08 MED ORDER — WEGOVY 2.4 MG/0.75ML ~~LOC~~ SOAJ: 2.4000 mg | SUBCUTANEOUS | 3 refills | Status: DC

## 2023-03-14 ENCOUNTER — Encounter: Payer: Self-pay | Admitting: Internal Medicine

## 2023-03-14 ENCOUNTER — Telehealth: Payer: Self-pay

## 2023-03-14 NOTE — Telephone Encounter (Signed)
Completed PA on [email or web address] for Wegovy 2.4 mg.  (Key: [facility]) Rx #: [id number] AVWUJW 2.4MG /0.75ML auto-injectors Form [employer] Electronic PA Form [phone] NCPDP)  Awaiting outcome. Patient informed.

## 2023-03-15 NOTE — Telephone Encounter (Signed)
PA was APPROVED through 03/13/2024.  Patient informed.  - Kathryn George

## 2023-04-27 ENCOUNTER — Ambulatory Visit
Admission: EM | Admit: 2023-04-27 | Discharge: 2023-04-27 | Payer: 59 | Attending: Emergency Medicine | Admitting: Emergency Medicine

## 2023-04-27 DIAGNOSIS — I160 Hypertensive urgency: Secondary | ICD-10-CM

## 2023-04-27 DIAGNOSIS — H1589 Other disorders of sclera: Secondary | ICD-10-CM

## 2023-04-27 NOTE — Discharge Instructions (Addendum)
Go to the emergency department for further evaluation of your right eye pain and redness, along with your elevated blood pressure.

## 2023-04-27 NOTE — ED Notes (Signed)
Patient is being discharged from the Urgent Care and sent to the Emergency Department via personal vehicle  . Per Becky Augusta NP, patient is in need of higher level of care due to hypertension. Patient is aware and verbalizes understanding of plan of care.

## 2023-04-27 NOTE — ED Triage Notes (Addendum)
Rt eye irritation started Monday. Patient states that she feels pressure in the corner of her eye. States that while she was working last night a floaty went across her eye and she got a sharp headache. She took tylenol for it that helped. Patient wears glasses.   W/correction  R-20/30 B-20/20 L- 20/30

## 2023-04-27 NOTE — ED Provider Notes (Signed)
[facility]    CSN: [id number] Arrival date & time: 04/27/23  0808      History   Chief Complaint Chief Complaint  Patient presents with   Eye Problem    HPI Kathryn George is a 47 y.o. female.   HPI  47 year old female with a past medical history significant for migraine headaches, GERD, anxiety, and heart murmur presents for evaluation of a red irritated right eye that started 3 days ago.  She states she feels like there is pressure in the lateral corner of her eye and while working last night she did experience a floater that was associated with a sharp headache.  She reports that she took Tylenol and the headache resolved and is not there at present.  She does wear corrective lenses and her ophthalmologist is my eye doctor in [place].  Past Medical History:  Diagnosis Date   Allergy    year round allergies- eyes water. nose runs.    Anxiety 2014   GERD (gastroesophageal reflux disease)    no meds   Headache    migraines   Heart murmur    h/o during pregnancy   Insomnia 2014    Patient Active Problem List   Diagnosis Date Noted   Encounter for screening colonoscopy    Prediabetes 12/01/2021   BMI 40.0-44.9, adult (HCC) 12/01/2021   Abnormal uterine bleeding (AUB) 09/16/2020   Intramural, submucous, and subserous leiomyoma of uterus 07/31/2020   Microcytic anemia 07/31/2020   Vitamin D deficiency 10/15/2019   Migraine without aura and without status migrainosus, not intractable 10/15/2019   Sebaceous cyst of skin of left breast 08/25/2018   Hyperlipidemia, mixed 04/17/2017   Menorrhagia with regular cycle 10/04/2016   Environmental and seasonal allergies 10/04/2016    Past Surgical History:  Procedure Laterality Date   COLONOSCOPY WITH PROPOFOL N/A 04/16/2022   Procedure: COLONOSCOPY WITH PROPOFOL;  Surgeon: Wyline Mood, MD;  Location: [facility] ENDOSCOPY;  Service: Gastroenterology;  Laterality: N/A;   DILATION AND CURETTAGE OF UTERUS   09/16/2020   Procedure: DILATATION AND CURETTAGE;  Surgeon: Vena Austria, MD;  Location: [facility] ORS;  Service: Gynecology;;   [name]  09/16/2020   Procedure: VAGINAL MYOMECTOMY;  Surgeon: Vena Austria, MD;  Location: [facility] ORS;  Service: Gynecology;;    OB History     Gravida  3   Para  3   Term  3   Preterm      AB      Living  3      SAB      IAB      Ectopic      Multiple      Live Births               Home Medications    Prior to Admission medications   Medication Sig Start Date End Date Taking? Authorizing Provider  Semaglutide-Weight Management (WEGOVY) 2.4 MG/0.75ML SOAJ Inject 2.4 mg into the skin once a week. 03/08/23  Yes Reubin Milan, MD    Family History Family History  Problem Relation Age of Onset   Breast cancer Mother 53   Diabetes Mother    Diabetes Maternal Aunt    Cancer Paternal Aunt    Diabetes Maternal Grandmother    Dementia Maternal Grandmother    Heart disease Maternal Grandfather 59   Breast cancer Paternal Grandmother     Social History Social History   Tobacco Use   Smoking status: Former    Passive exposure:  Past   Smokeless tobacco: Never  Vaping Use   Vaping status: Never Used  Substance Use Topics   Alcohol use: Yes    Comment: social   Drug use: No     Allergies   Shellfish allergy   Review of Systems Review of Systems  Constitutional:  Negative for fever.  Eyes:  Positive for pain, redness and visual disturbance. Negative for photophobia, discharge and itching.     Physical Exam Triage Vital Signs ED Triage Vitals  Encounter Vitals Group     BP      Systolic BP Percentile      Diastolic BP Percentile      Pulse      Resp      Temp      Temp src      SpO2      Weight      Height      Head Circumference      Peak Flow      Pain Score      Pain Loc      Pain Education      Exclude from Growth Chart    No data found.  Updated Vital Signs BP (!) 160/126 (BP  Location: Left Arm)   Pulse 60   Temp 98.2 F (36.8 C) (Oral)   Resp 19   LMP 01/02/2022 (Approximate)   SpO2 95%   Visual Acuity Right Eye Distance:   Left Eye Distance:   Bilateral Distance:    Right Eye Near: R Near: 20/30 (with correction) Left Eye Near:  L Near: 20/30 (with correction) Bilateral Near:  20/30 (with correction)  Physical Exam Vitals and nursing note reviewed.  Constitutional:      Appearance: Normal appearance. She is not ill-appearing.  HENT:     Head: Normocephalic and atraumatic.  Eyes:     General:        Right eye: No discharge.        Left eye: No discharge.     Extraocular Movements: Extraocular movements intact.     Conjunctiva/sclera: Conjunctivae normal.     Pupils: Pupils are equal, round, and reactive to light.     Comments: Sclera of right eye is erythematous and injected.  The globe and lateral inferior periorbital region tender to palpation.  Skin:    General: Skin is warm and dry.     Capillary Refill: Capillary refill takes less than 2 seconds.     Findings: No erythema.  Neurological:     General: No focal deficit present.     Mental Status: She is alert and oriented to person, place, and time.      UC Treatments / Results  Labs (all labs ordered are listed, but only abnormal results are displayed) Labs Reviewed - No data to display  EKG   Radiology No results found.  Procedures Procedures (including critical care time)  Medications Ordered in UC Medications - No data to display  Initial Impression / Assessment and Plan / UC Course  I have reviewed the triage vital signs and the nursing notes.  Pertinent labs & imaging results that were available during my care of the patient were reviewed by me and considered in my medical decision making (see chart for details).   Patient is a nontoxic-appearing 47 year old female presenting for evaluation of right eye pain.  She does see an optometrist in [place] at my eye  doctor and reports that her last eye exam was within the year.  She does wear corrective lenses.  Visual acuity shows OD 20/30, OS 20/30, OU 20/20 with correction.  As you can see the above, the sclera of the right eye is injected.  The labral conjunctiva are unremarkable and there is no discharge appreciated.  The globe itself and the lateral inferior periorbital region are tender to palpation.  There is no induration, ecchymosis, or edema on exam.  Patient's right pupil does respond but it is slower than the left to direct light and accommodation.  Her EOM is intact.  It is unclear as to what is causing her eye discomfort and injection.  She is hypertensive at 183/128 with no documented history of hypertension.  I will have staff recheck her vital signs.  I did contact her optometrist in [place] but they do not have a provider in office today.  I have called and left a message with [facility] for further evaluation.  Patient's blood pressure remains elevated at recheck 160/126.  Given patient's hypertension and visual complaints of concern there might be a developing intracranial process so I have referred her to the emergency department.  She has elected to go to [facility] via [facility]OV for evaluation.  Final Clinical Impressions(s) / UC Diagnoses   Final diagnoses:  Hypertensive urgency  Injection of surface of right eye     Discharge Instructions      Go to the emergency department for further evaluation of your right eye pain and redness, along with your elevated blood pressure.     ED Prescriptions   None    PDMP not reviewed this encounter.   Becky Augusta, NP 04/27/23 [phone]

## 2023-05-03 ENCOUNTER — Encounter: Payer: Self-pay | Admitting: Internal Medicine

## 2023-05-03 ENCOUNTER — Ambulatory Visit: Payer: 59 | Admitting: Internal Medicine

## 2023-05-03 VITALS — BP 112/78 | HR 74 | Ht 63.0 in | Wt 221.0 lb

## 2023-05-03 DIAGNOSIS — R030 Elevated blood-pressure reading, without diagnosis of hypertension: Secondary | ICD-10-CM

## 2023-05-03 DIAGNOSIS — H1031 Unspecified acute conjunctivitis, right eye: Secondary | ICD-10-CM

## 2023-05-03 NOTE — Progress Notes (Signed)
Date:  05/03/2023   Name:  Kathryn George   DOB:  [birth year]   MRN:  [id number]   Chief Complaint: Hypertension BP elevated at [facility] and ED last week with eye pain and conjunctivitis.  She was treated with eye drops.  Advised to follow up here for blood pressure.  Hypertension This is a new problem. The current episode started in the past 7 days. The problem has been rapidly improving since onset. The problem is controlled. Pertinent negatives include no chest pain, headaches, palpitations or shortness of breath. Past treatments include nothing.  Conjunctivitis  The problem has been rapidly improving. The problem is mild. The symptoms are relieved by one or more prescription drugs. Pertinent negatives include no fever and no headaches.    Review of Systems  Constitutional:  Negative for chills and fever.  Eyes:  Negative for visual disturbance.  Respiratory:  Negative for chest tightness and shortness of breath.   Cardiovascular:  Negative for chest pain and palpitations.  Neurological:  Negative for dizziness and headaches.  Psychiatric/Behavioral:  Negative for dysphoric mood and sleep disturbance. The patient is not nervous/anxious.     CTA Chest/abdomen: FINDINGS:   AORTA: Normal caliber aorta. No thoracic aortic intramural hematoma.  No aortic dissection. The left vertebral artery is incidentally noted to arise directly off the aorta just distal to the takeoff of the left subclavian artery.   CHEST: Normal heart size.  No pericardial effusion. No mediastinal lymphadenopathy. Main pulmonary artery is normal in size. No central filling defects.   Clear central airways. No consolidation. No suspicious pulmonary nodules. No pleural effusion.   ABDOMEN and PELVIS:  HEPATOBILIARY: Hypoattenuating lesion within the right hepatic lobe measuring 1.2 cm (5:80) with peripheral nodular enhancement most likely represents a hemangioma. Hyperenhancing focus within hepatic segment 5  measuring 0.6 cm (5:95) most likely represents a flash filling hemangioma. The gallbladder is normal in appearance. No biliary dilatation.   SPLEEN: Unremarkable.  PANCREAS: Unremarkable.   ADRENALS: Unremarkable.  KIDNEYS/URETERS: Symmetric nephrograms. No hydronephrosis. No solid renal lesions.   BLADDER: Unremarkable.  PELVIC/REPRODUCTIVE ORGANS: Anteverted uterus with multiple fibroids. Some of the fibroids are partially calcified.   GI TRACT: Small hiatal hernia. No dilated or thick walled loops of bowel. Normal appendix (5:123).   PERITONEUM/RETROPERITONEUM AND MESENTERY: No free air or fluid.  LYMPH NODES: No enlarged lymph nodes.   BONES: Moderate degenerative disc disease at L5-S1. No acute osseous abnormality.   SOFT TISSUES: Small fat-containing umbilical hernia.   Lab Results  Component Value Date   NA 138 01/19/2023   K 3.6 01/19/2023   CO2 24 01/19/2023   GLUCOSE 78 01/19/2023   BUN 10 01/19/2023   CREATININE 0.83 01/19/2023   CALCIUM 9.5 01/19/2023   EGFR 86 01/19/2023   GFRNONAA >60 09/12/2020   Lab Results  Component Value Date   CHOL 204 (H) 01/19/2023   HDL 52 01/19/2023   LDLCALC 132 (H) 01/19/2023   TRIG 113 01/19/2023   CHOLHDL 3.9 01/19/2023   Lab Results  Component Value Date   TSH 0.872 01/19/2023   Lab Results  Component Value Date   HGBA1C 5.7 (H) 02/04/2022   Lab Results  Component Value Date   WBC 5.6 01/19/2023   HGB 11.9 01/19/2023   HCT 38.3 01/19/2023   MCV 74 (L) 01/19/2023   PLT 287 01/19/2023   Lab Results  Component Value Date   ALT 15 01/19/2023   AST 12 01/19/2023   ALKPHOS 71  01/19/2023   BILITOT 0.3 01/19/2023   Lab Results  Component Value Date   VD25OH 15.8 (L) 02/04/2022     Patient Active Problem List   Diagnosis Date Noted   Encounter for screening colonoscopy    Prediabetes 12/01/2021   BMI 40.0-44.9, adult (HCC) 12/01/2021   Abnormal uterine bleeding (AUB) 09/16/2020   Intramural, submucous, and  subserous leiomyoma of uterus 07/31/2020   Microcytic anemia 07/31/2020   Vitamin D deficiency 10/15/2019   Migraine without aura and without status migrainosus, not intractable 10/15/2019   Sebaceous cyst of skin of left breast 08/25/2018   Hyperlipidemia, mixed 04/17/2017   Menorrhagia with regular cycle 10/04/2016   Environmental and seasonal allergies 10/04/2016    Allergies  Allergen Reactions   Shellfish Allergy Shortness Of Breath and Swelling    CRAB AND LOBSTER    Past Surgical History:  Procedure Laterality Date   COLONOSCOPY WITH PROPOFOL N/A 04/16/2022   Procedure: COLONOSCOPY WITH PROPOFOL;  Surgeon: Wyline Mood, MD;  [facility] ENDOSCOPY;  Service: Gastroenterology;  Laterality: N/A;   DILATION AND CURETTAGE OF UTERUS  09/16/2020   Procedure: DILATATION AND CURETTAGE;  Surgeon: Vena Austria, MD;  Location: [facility] ORS;  Service: Gynecology;;   Kathryn George  09/16/2020   Procedure: VAGINAL MYOMECTOMY;  Surgeon: Vena Austria, MD;  Location: [facility] ORS;  Service: Gynecology;;    Social History   Tobacco Use   Smoking status: Former    Passive exposure: Past   Smokeless tobacco: Never  Vaping Use   Vaping status: Never Used  Substance Use Topics   Alcohol use: Yes    Comment: social   Drug use: No     Medication list has been reviewed and updated.  Current Meds  Medication Sig   Semaglutide-Weight Management (WEGOVY) 2.4 MG/0.75ML SOAJ Inject 2.4 mg into the skin once a week.       05/03/2023    1:54 PM 01/19/2023    1:26 PM 10/05/2022    2:02 PM 08/10/2022    1:51 PM  GAD 7 : Generalized Anxiety Score  Nervous, Anxious, on Edge 0 0 0 0  Control/stop worrying 0 0 0 0  Worry too much - different things 0 1 2 1   Trouble relaxing 0 1 1 0  Restless 0 0 1 0  Easily annoyed or irritable 0 0 1 0  Afraid - awful might happen 0 0 0 0  Total GAD 7 Score 0 2 5 1   Anxiety Difficulty Not difficult at all Not difficult at all Not difficult at all Not  difficult at all       05/03/2023    1:54 PM 01/19/2023    1:26 PM 10/05/2022    2:01 PM  Depression screen PHQ 2/9  Decreased Interest 0 0 0  Down, Depressed, Hopeless 0 0 0  PHQ - 2 Score 0 0 0  Altered sleeping 0 1 1  Tired, decreased energy 0 1 1  Change in appetite 0 0 0  Feeling bad or failure about yourself  0 0 0  Trouble concentrating 0 0 0  Moving slowly or fidgety/restless 0 0 0  Suicidal thoughts 0 0 0  PHQ-9 Score 0 2 2  Difficult doing work/chores Not difficult at all Not difficult at all Not difficult at all    BP Readings from Last 3 Encounters:  05/03/23 112/78  04/27/23 (!) 160/126  01/19/23 122/84    Physical Exam Constitutional:      Appearance: Normal appearance.  Cardiovascular:  Rate and Rhythm: Normal rate and regular rhythm.     Heart sounds: No murmur heard. Pulmonary:     Effort: Pulmonary effort is normal. No respiratory distress.     Breath sounds: No stridor. No wheezing or rhonchi.  Musculoskeletal:     Cervical back: Normal range of motion.     Right lower leg: No edema.     Left lower leg: No edema.  Lymphadenopathy:     Cervical: No cervical adenopathy.  Skin:    General: Skin is warm and dry.     Capillary Refill: Capillary refill takes less than 2 seconds.  Neurological:     General: No focal deficit present.     Mental Status: She is alert.     Wt Readings from Last 3 Encounters:  05/03/23 221 lb (100.2 kg)  01/19/23 229 lb (103.9 kg)  10/05/22 240 lb (108.9 kg)    BP 112/78   Pulse 74   Ht 5\' 3"  (1.6 m)   Wt 221 lb (100.2 kg)   LMP 01/02/2022 (Approximate)   SpO2 97%   BMI 39.15 kg/m   Assessment and Plan:  Problem List Items Addressed This Visit   None Visit Diagnoses     Elevated blood pressure, situational    -  Primary   BP now normalized Reviewed CTA from ED - minor, benign findings discussed   Acute bacterial conjunctivitis of right eye       mostly resolved with minimal symptoms follow up with  Ophthalmology this week as planned       No follow-ups on file.    Reubin Milan, MD [facility] [place]

## 2023-05-24 ENCOUNTER — Ambulatory Visit: Payer: 59 | Admitting: Internal Medicine

## 2023-05-26 LAB — HM MAMMOGRAPHY

## 2023-06-18 ENCOUNTER — Telehealth: Payer: 59

## 2023-06-18 ENCOUNTER — Encounter: Payer: Self-pay | Admitting: Emergency Medicine

## 2023-06-18 ENCOUNTER — Emergency Department
Admission: EM | Admit: 2023-06-18 | Discharge: 2023-06-18 | Disposition: A | Discharge status: Home / Self Care | Payer: 59 | Attending: Emergency Medicine | Admitting: Emergency Medicine

## 2023-06-18 ENCOUNTER — Emergency Department: Payer: 59

## 2023-06-18 ENCOUNTER — Ambulatory Visit (INDEPENDENT_AMBULATORY_CARE_PROVIDER_SITE_OTHER)
Admission: EM | Admit: 2023-06-18 | Discharge: 2023-06-18 | Disposition: A | Discharge status: Home / Self Care | Payer: 59 | Source: Home / Self Care

## 2023-06-18 ENCOUNTER — Other Ambulatory Visit: Payer: Self-pay

## 2023-06-18 DIAGNOSIS — J069 Acute upper respiratory infection, unspecified: Secondary | ICD-10-CM

## 2023-06-18 DIAGNOSIS — R7303 Prediabetes: Secondary | ICD-10-CM | POA: Diagnosis not present

## 2023-06-18 DIAGNOSIS — Z7985 Long-term (current) use of injectable non-insulin antidiabetic drugs: Secondary | ICD-10-CM | POA: Insufficient documentation

## 2023-06-18 DIAGNOSIS — R070 Pain in throat: Secondary | ICD-10-CM | POA: Diagnosis present

## 2023-06-18 DIAGNOSIS — J039 Acute tonsillitis, unspecified: Secondary | ICD-10-CM | POA: Diagnosis not present

## 2023-06-18 DIAGNOSIS — J36 Peritonsillar abscess: Secondary | ICD-10-CM | POA: Diagnosis not present

## 2023-06-18 DIAGNOSIS — Z87891 Personal history of nicotine dependence: Secondary | ICD-10-CM | POA: Insufficient documentation

## 2023-06-18 LAB — CBC
HCT: 41.3 % (ref 36.0–46.0)
Hemoglobin: 13 g/dL (ref 12.0–15.0)
MCH: 23.9 pg — ABNORMAL LOW (ref 26.0–34.0)
MCHC: 31.5 g/dL (ref 30.0–36.0)
MCV: 75.8 fL — ABNORMAL LOW (ref 80.0–100.0)
Platelets: 451 10*3/uL — ABNORMAL HIGH (ref 150–400)
RBC: 5.45 MIL/uL — ABNORMAL HIGH (ref 3.87–5.11)
RDW: 14 % (ref 11.5–15.5)
WBC: 8.2 10*3/uL (ref 4.0–10.5)
nRBC: 0 % (ref 0.0–0.2)

## 2023-06-18 LAB — BASIC METABOLIC PANEL
Anion gap: 11 (ref 5–15)
BUN: 7 mg/dL (ref 6–20)
CO2: 24 mmol/L (ref 22–32)
Calcium: 8.7 mg/dL — ABNORMAL LOW (ref 8.9–10.3)
Chloride: 100 mmol/L (ref 98–111)
Creatinine, Ser: 0.67 mg/dL (ref 0.44–1.00)
GFR, Estimated: >60 mL/min (ref >60)
Glucose, Bld: 93 mg/dL (ref 70–99)
Potassium: 3.1 mmol/L — ABNORMAL LOW (ref 3.5–5.1)
Sodium: 135 mmol/L (ref 135–145)

## 2023-06-18 LAB — GROUP A STREP BY PCR
Group A Strep by PCR: NOT DETECTED
Group A Strep by PCR: NOT DETECTED

## 2023-06-18 LAB — MONONUCLEOSIS SCREEN: Mono Screen: NEGATIVE

## 2023-06-18 MED ORDER — SODIUM CHLORIDE 0.9 % IV SOLN
3.0000 g | Freq: Once | INTRAVENOUS | Status: AC
  Administered 2023-06-18: 3 g via INTRAVENOUS
  Filled 2023-06-18: qty 8

## 2023-06-18 MED ORDER — AMOXICILLIN-POT CLAVULANATE 600-42.9 MG/5ML PO SUSR: 875.0000 mg | Freq: Two times a day (BID) | ORAL | 0 refills | Status: DC

## 2023-06-18 MED ORDER — PREDNISONE 10 MG (21) PO TBPK: ORAL_TABLET | ORAL | 0 refills | Status: DC

## 2023-06-18 MED ORDER — LACTATED RINGERS IV BOLUS
1000.0000 mL | Freq: Once | INTRAVENOUS | Status: AC
  Administered 2023-06-18: 1000 mL via INTRAVENOUS

## 2023-06-18 MED ORDER — DEXAMETHASONE SODIUM PHOSPHATE 10 MG/ML IJ SOLN
10.0000 mg | Freq: Once | INTRAMUSCULAR | Status: AC
  Administered 2023-06-18: 10 mg via INTRAVENOUS
  Filled 2023-06-18: qty 1

## 2023-06-18 MED ORDER — IOHEXOL 300 MG/ML  SOLN
75.0000 mL | Freq: Once | INTRAMUSCULAR | Status: AC | PRN
  Administered 2023-06-18: 75 mL via INTRAVENOUS

## 2023-06-18 NOTE — Discharge Instructions (Addendum)
Please use ibuprofen (Motrin) up to 800 mg every 8 hours, naproxen (Naprosyn) up to 500 mg every 12 hours, and/or acetaminophen (Tylenol) up to 4 g/day for any continued pain.  Please do not use this medication regimen for longer than 7 days

## 2023-06-18 NOTE — Discharge Instructions (Addendum)
Please go to the emergency department at [facility] to be evaluated for possible peritonsillar abscess.  Please go now.

## 2023-06-18 NOTE — ED Provider Notes (Addendum)
[facility]    CSN: [id number] Arrival date & time: 06/18/23  0858      History   Chief Complaint Chief Complaint  Patient presents with   Cough   Sore Throat    HPI Kathryn George is a 47 y.o. female.   HPI  47 year old female with a past medical history significant for migraines, hyperlipidemia, and GERD presents for evaluation of respiratory symptoms.  Her primary symptom is a sore throat with associated headache, runny nose, nasal congestion, and fevers at night that started 3 days ago.  She is also endorsing ear pain that started while she was in the waiting room.  Additionally, she has been experiencing a nonproductive cough with shortness breath and wheezing since Thanksgiving.  Past Medical History:  Diagnosis Date   Allergy    year round allergies- eyes water. nose runs.    Anxiety 2014   GERD (gastroesophageal reflux disease)    no meds   Headache    migraines   Heart murmur    h/o during pregnancy   Insomnia 2014    Patient Active Problem List   Diagnosis Date Noted   Encounter for screening colonoscopy    Prediabetes 12/01/2021   BMI 40.0-44.9, adult (HCC) 12/01/2021   Abnormal uterine bleeding (AUB) 09/16/2020   Intramural, submucous, and subserous leiomyoma of uterus 07/31/2020   Microcytic anemia 07/31/2020   Vitamin D deficiency 10/15/2019   Migraine without aura and without status migrainosus, not intractable 10/15/2019   Sebaceous cyst of skin of left breast 08/25/2018   Hyperlipidemia, mixed 04/17/2017   Menorrhagia with regular cycle 10/04/2016   Environmental and seasonal allergies 10/04/2016    Past Surgical History:  Procedure Laterality Date   COLONOSCOPY WITH PROPOFOL N/A 04/16/2022   Procedure: COLONOSCOPY WITH PROPOFOL;  Surgeon: Wyline Mood, MD;  Location: [facility] ENDOSCOPY;  Service: Gastroenterology;  Laterality: N/A;   DILATION AND CURETTAGE OF UTERUS  09/16/2020   Procedure: DILATATION AND CURETTAGE;  Surgeon:  Vena Austria, MD;  Location: [facility] ORS;  Service: Gynecology;;   [name]  09/16/2020   Procedure: VAGINAL MYOMECTOMY;  Surgeon: Vena Austria, MD;  Location: [facility] ORS;  Service: Gynecology;;    OB History     Gravida  3   Para  3   Term  3   Preterm      AB      Living  3      SAB      IAB      Ectopic      Multiple      Live Births               Home Medications    Prior to Admission medications   Medication Sig Start Date End Date Taking? Authorizing Provider  Semaglutide-Weight Management (WEGOVY) 2.4 MG/0.75ML SOAJ Inject 2.4 mg into the skin once a week. 03/08/23  Yes Reubin Milan, MD    Family History Family History  Problem Relation Age of Onset   Breast cancer Mother 63   Diabetes Mother    Diabetes Maternal Aunt    Cancer Paternal Aunt    Diabetes Maternal Grandmother    Dementia Maternal Grandmother    Heart disease Maternal Grandfather 8   Breast cancer Paternal Grandmother     Social History Social History   Tobacco Use   Smoking status: Former    Passive exposure: Past   Smokeless tobacco: Never  Vaping Use   Vaping status: Never Used  Substance  Use Topics   Alcohol use: Yes    Comment: social   Drug use: No     Allergies   Shellfish allergy   Review of Systems Review of Systems  Constitutional:  Positive for fever.  HENT:  Positive for congestion, ear pain, rhinorrhea and sore throat.   Respiratory:  Positive for cough, shortness of breath and wheezing.      Physical Exam Triage Vital Signs ED Triage Vitals  Encounter Vitals Group     BP 06/18/23 1014 (!) 161/103     Systolic BP Percentile --      Diastolic BP Percentile --      Pulse Rate 06/18/23 1014 74     Resp 06/18/23 1014 14     Temp 06/18/23 1014 98.5 F (36.9 C)     Temp Source 06/18/23 1014 Oral     SpO2 06/18/23 1014 100 %     Weight 06/18/23 1013 220 lb 14.4 oz (100.2 kg)     Height 06/18/23 1013 5\' 3"  (1.6 m)     Head  Circumference --      Peak Flow --      Pain Score 06/18/23 1013 8     Pain Loc --      Pain Education --      Exclude from Growth Chart --    No data found.  Updated Vital Signs BP (!) 161/103 (BP Location: Left Arm)   Pulse 74   Temp 98.5 F (36.9 C) (Oral)   Resp 14   Ht 5\' 3"  (1.6 m)   Wt 220 lb 14.4 oz (100.2 kg)   LMP 01/02/2022 (Approximate)   SpO2 100%   BMI 39.13 kg/m   Visual Acuity Right Eye Distance:   Left Eye Distance:   Bilateral Distance:    Right Eye Near:   Left Eye Near:    Bilateral Near:     Physical Exam Vitals and nursing note reviewed.  Constitutional:      Appearance: Normal appearance. She is not ill-appearing.  HENT:     Head: Normocephalic and atraumatic.     Right Ear: Tympanic membrane, ear canal and external ear normal. There is no impacted cerumen.     Left Ear: Tympanic membrane, ear canal and external ear normal. There is no impacted cerumen.     Nose: Congestion and rhinorrhea present.     Comments: Nasal mucosa is edematous with clear discharge in both nares.    Mouth/Throat:     Mouth: Mucous membranes are moist.     Pharynx: Oropharyngeal exudate and posterior oropharyngeal erythema present.     Comments: Left tonsillar pillar is massively edematous and is encroaching to the midline of the oropharynx touching the uvula.  Right tonsillar pillar is mildly edematous and erythematous.  White exudate is present. Neck:     Comments: Bilateral anterior cervical lymphadenopathy is present.  The lymphadenopathy is tender.  Patient also has a tender area of fullness at the angle of the jaw on the left. Cardiovascular:     Rate and Rhythm: Normal rate and regular rhythm.     Pulses: Normal pulses.     Heart sounds: Normal heart sounds. No murmur heard.    No friction rub. No gallop.  Pulmonary:     Effort: Pulmonary effort is normal.     Breath sounds: No stridor. Wheezing and rhonchi present. No rales.     Comments: Patient has  scattered wheezes and rhonchi to chest auscultation.  No  stridor appreciated when auscultating over the trachea. Musculoskeletal:     Cervical back: Normal range of motion. Tenderness present.  Lymphadenopathy:     Cervical: Cervical adenopathy present.  Skin:    General: Skin is warm and dry.     Capillary Refill: Capillary refill takes less than 2 seconds.     Findings: No rash.  Neurological:     General: No focal deficit present.     Mental Status: She is alert and oriented to person, place, and time.      UC Treatments / Results  Labs (all labs ordered are listed, but only abnormal results are displayed) Labs Reviewed  GROUP A STREP BY PCR    EKG   Radiology No results found.  Procedures Procedures (including critical care time)  Medications Ordered in UC Medications - No data to display  Initial Impression / Assessment and Plan / UC Course  I have reviewed the triage vital signs and the nursing notes.  Pertinent labs & imaging results that were available during my care of the patient were reviewed by me and considered in my medical decision making (see chart for details).   Patient is a pleasant 47 year old female presenting for evaluation of respiratory symptoms as outlined HPI above.  Her most prominent symptom is a sore throat.  As you can see in image above, the left tonsillar pillar is markedly erythematous and edematous and is touching the uvula.  There is white exudate present.  Given the asymmetrical nature of the tonsillar hypertrophy, coupled with the fullness at the angle of the jaw on the left, I am concerned for possible peritonsillar abscess.  The patient is able to speak in full sentence without dyspnea or tachypnea.  No trismus present.  Patient is managing her own secretions though states that it is painful.  I have therefore advised the patient to go to the emergency department for evaluation and she has elected to go to [facility].  I have called and  spoken with Morrie Sheldon, the charge nurse in the ER at [facility] to advise her of the patient's imminent arrival.  Patient left ambulatory in a stable condition.  Strep PCR was collected at triage and is pending.  Strep PCR is negative.  Final Clinical Impressions(s) / UC Diagnoses   Final diagnoses:  Peritonsillar abscess  Upper respiratory tract infection, unspecified type     Discharge Instructions      Please go to the emergency department at [facility] to be evaluated for possible peritonsillar abscess.  Please go now.     ED Prescriptions   None    PDMP not reviewed this encounter.   Becky Augusta, NP 06/18/23 1027    Becky Augusta, NP 06/18/23 1027    Becky Augusta, NP 06/18/23 [phone]

## 2023-06-18 NOTE — ED Triage Notes (Signed)
Patient reports she developed a cough the day after Thanksgiving.  Patient reports sore throat and headache that started 3 days ago.  Patient unsure of fevers.

## 2023-06-18 NOTE — ED Provider Notes (Signed)
[facility] Provider Note   Event Date/Time   First MD Initiated Contact with Patient 06/18/23 1228     (approximate) History  peritonsillar abscess rule out  HPI Kathryn George is a 47 y.o. female with stated past medical history of hyperlipidemia, prediabetes, and migraines who presents complaining of 3 days of worsening throat pain that is worse on the left than the right with associated left neck tenderness.  Patient states that she has been having painful p.o. intake over this time as well. ROS: Patient currently denies any vision changes, tinnitus, difficulty speaking, facial droop, chest pain, shortness of breath, abdominal pain, nausea/vomiting/diarrhea, dysuria, or weakness/numbness/paresthesias in any extremity   Physical Exam  Triage Vital Signs: ED Triage Vitals  Encounter Vitals Group     BP 06/18/23 1112 (!) 153/99     Systolic BP Percentile --      Diastolic BP Percentile --      Pulse Rate 06/18/23 1110 84     Resp 06/18/23 1110 18     Temp 06/18/23 1110 97.7 F (36.5 C)     Temp Source 06/18/23 1110 Oral     SpO2 06/18/23 1110 96 %     Weight 06/18/23 1108 220 lb 14.4 oz (100.2 kg)     Height 06/18/23 1108 5\' 3"  (1.6 m)     Head Circumference --      Peak Flow --      Pain Score 06/18/23 1104 8     Pain Loc --      Pain Education --      Exclude from Growth Chart --    Most recent vital signs: Vitals:   06/18/23 1110 06/18/23 1112  BP:  (!) 153/99  Pulse: 84   Resp: 18   Temp: 97.7 F (36.5 C)   SpO2: 96%    General: Awake, oriented x4. CV:  Good peripheral perfusion.  Resp:  Normal effort.  Abd:  No distention.  Other:  Middle-aged overweight African-American female resting comfortably in no acute distress.  Erythematous posterior oropharynx with exudate and significantly edematous left tonsillar pillar.  There is tender anterior left cervical lymphadenopathy ED Results / Procedures / Treatments  Labs (all labs  ordered are listed, but only abnormal results are displayed) Labs Reviewed  CBC - Abnormal; Notable for the following components:      Result Value   RBC 5.45 (*)    MCV 75.8 (*)    MCH 23.9 (*)    Platelets 451 (*)    All other components within normal limits  BASIC METABOLIC PANEL - Abnormal; Notable for the following components:   Potassium 3.1 (*)    Calcium 8.7 (*)    All other components within normal limits  GROUP A STREP BY PCR  MONONUCLEOSIS SCREEN  RADIOLOGY ED MD interpretation: CT soft tissue of the neck independently interpreted shows asymmetric left palate teen tonsillar hypertrophy with heterogeneity and a low density focus but no discrete abscess -Agree with radiology assessment Official radiology report(s): CT Soft Tissue Neck W Contrast Result Date: 06/18/2023 CLINICAL DATA:  Epiglottitis or tonsillitis suspected. Throat swelling and pain for 3 days. Unable swallow. EXAM: CT NECK WITH CONTRAST TECHNIQUE: Multidetector CT imaging of the neck was performed using the standard protocol following the bolus administration of intravenous contrast. RADIATION DOSE REDUCTION: This exam was performed according to the departmental dose-optimization program which includes automated exposure control, adjustment of the mA and/or kV according to patient size and/or use  of iterative reconstruction technique. CONTRAST:  75mL OMNIPAQUE IOHEXOL 300 MG/ML  SOLN COMPARISON:  None Available. FINDINGS: Pharynx and larynx: Moderate adenoid hypertrophy is asymmetric on the left. No discrete lesion is present. Asymmetric left palatine tonsillar hypertrophy is present. Heterogeneity is present with a low-density focus but no discrete abscess adjacent to the left palatine tonsil. Soft palate is edematous. Vallecula and epiglottis are within normal limits. Aryepiglottic folds and piriform sinuses are clear. Vocal cords are midline and symmetric. Trachea is clear. Salivary glands: The submandibular and  parotid glands and ducts are within normal limits. Thyroid: Normal. Lymph nodes: Enlarged level 2 lymph nodes are present bilaterally, left greater than right. Mildly enlarged left level 3 and level 4 lymph nodes are likely reactive as well. No necrotic or suppurative nodes are present. Vascular: No significant vascular calcifications are present. Limited intracranial: Within normal limits. Visualized orbits: The globes and orbits are within normal limits. Mastoids and visualized paranasal sinuses: The paranasal sinuses and mastoid air cells are clear. Skeleton: Vertebral body heights and alignment are normal. Exaggerated upper thoracic kyphosis is present. Straightening of the normal cervical lordosis is present. No focal osseous lesions are present. Upper chest: The lung apices are clear. The thoracic inlet is within normal limits. IMPRESSION: 1. Asymmetric left palatine tonsillar hypertrophy with heterogeneity and a low-density focus but no discrete abscess adjacent to the left palatine tonsil. The findings are consistent with acute pharyngitis/tonsillitis. 2. Moderate adenoid hypertrophy is asymmetric on the left. 3. Enlarged level 2 lymph nodes bilaterally, left greater than right are likely reactive. Mildly enlarged left level 3 and level 4 lymph nodes are likely reactive as well. No necrotic or suppurative nodes are present. Electronically Signed   By: Marin Roberts M.D.   On: 06/18/2023 14:12   PROCEDURES: Critical Care performed: No Procedures MEDICATIONS ORDERED IN ED: Medications  dexamethasone (DECADRON) injection 10 mg (10 mg Intravenous Given 06/18/23 1334)  Ampicillin-Sulbactam (UNASYN) 3 g in sodium chloride 0.9 % 100 mL IVPB (3 g Intravenous New Bag/Given 06/18/23 1333)  iohexol (OMNIPAQUE) 300 MG/ML solution 75 mL (75 mLs Intravenous Contrast Given 06/18/23 1326)  lactated ringers bolus 1,000 mL (1,000 mLs Intravenous New Bag/Given 06/18/23 1357)   IMPRESSION / MDM / ASSESSMENT  AND PLAN / ED COURSE  I reviewed the triage vital signs and the nursing notes.                             The patient is on the cardiac monitor to evaluate for evidence of arrhythmia and/or significant heart rate changes. Patient's presentation is most consistent with acute presentation with potential threat to life or bodily function. 47 year old female presents for sore throat with tonsillar swelling from urgent care No history of immunocompromise. Nontoxic appearance. Patient euvolemic with no trismus. No airway compromise. No change in voice, exudates, enlarged lymph nodes. Able to tolerate PO. Given History and Exam I have low suspicion for this presentation being caused by PTA, RPA, Ludwigs angina, Epiglottitis or Bacterial Tracheitis, EBV, acute HIV, or Strep throat. CT of the soft tissue of the neck with IV contrast does not show any discernible drainable fluid collection within the left tonsil. Dispo: Discharge home   FINAL CLINICAL IMPRESSION(S) / ED DIAGNOSES   Final diagnoses:  Tonsillitis with exudate  Peritonsillar cellulitis   Rx / DC Orders   ED Discharge Orders          Ordered    amoxicillin-clavulanate (AUGMENTIN)  600-42.9 MG/5ML suspension  2 times daily        06/18/23 1451    predniSONE (STERAPRED UNI-PAK 21 TAB) 10 MG (21) TBPK tablet        06/18/23 1451           Note:  This document was prepared using Dragon voice recognition software and may include unintentional dictation errors.   Merwyn Katos, MD 06/20/23 0230

## 2023-06-18 NOTE — ED Triage Notes (Signed)
Pt sent from [place] [facility] to rule out L peritonsillar abscess. Pt has muffled voice. Pain and swelling to R throat since 3 days. Able to swallow secretions.

## 2023-06-18 NOTE — ED Notes (Signed)
Patient is being discharged from the Urgent Care and sent to the [facility] Emergency Department via private vehicle . Per Becky Augusta, NP, patient is in need of higher level of care due to tonsil abscess. Patient is aware and verbalizes understanding of plan of care.  Vitals:   06/18/23 1014  BP: (!) 161/103  Pulse: 74  Resp: 14  Temp: 98.5 F (36.9 C)  SpO2: 100%

## 2023-09-21 ENCOUNTER — Encounter: Payer: Self-pay | Admitting: Podiatry

## 2023-09-21 ENCOUNTER — Ambulatory Visit (INDEPENDENT_AMBULATORY_CARE_PROVIDER_SITE_OTHER): Admitting: Podiatry

## 2023-09-21 DIAGNOSIS — M7752 Other enthesopathy of left foot: Secondary | ICD-10-CM | POA: Diagnosis not present

## 2023-09-21 DIAGNOSIS — D2372 Other benign neoplasm of skin of left lower limb, including hip: Secondary | ICD-10-CM

## 2023-09-21 DIAGNOSIS — M778 Other enthesopathies, not elsewhere classified: Secondary | ICD-10-CM

## 2023-09-21 MED ORDER — DEXAMETHASONE SODIUM PHOSPHATE 120 MG/30ML IJ SOLN
2.0000 mg | Freq: Once | INTRAMUSCULAR | Status: AC
  Administered 2023-09-21: 2 mg via INTRA_ARTICULAR

## 2023-09-21 NOTE — Progress Notes (Signed)
 We will  Subjective:  Patient ID: Kathryn George, female    DOB: February 03, 1976,  MRN: [id number] HPI Chief Complaint  Patient presents with   Foot Pain    Sub 5th MPJ left - small, callused area x 2-3 weeks, tender with walking, no treatment   New Patient (Initial Visit)    Est pt 08/2020    48 y.o. female presents with the above complaint.   ROS: Denies fever chills nausea vomit muscle aches pains calf pain back pain chest pain shortness of breath.  States that she recently purchased a new pair of work boots they may have done this.  Past Medical History:  Diagnosis Date   Allergy    year round allergies- eyes water. nose runs.    Anxiety 2014   GERD (gastroesophageal reflux disease)    no meds   Headache    migraines   Heart murmur    h/o during pregnancy   Insomnia 2014   Past Surgical History:  Procedure Laterality Date   COLONOSCOPY WITH PROPOFOL N/A 04/16/2022   Procedure: COLONOSCOPY WITH PROPOFOL;  Surgeon: Wyline Mood, MD;  Location: [facility] ENDOSCOPY;  Service: Gastroenterology;  Laterality: N/A;   DILATION AND CURETTAGE OF UTERUS  09/16/2020   Procedure: DILATATION AND CURETTAGE;  Surgeon: Vena Austria, MD;  Location: [facility] ORS;  Service: Gynecology;;   Kathryn George  09/16/2020   Procedure: VAGINAL MYOMECTOMY;  Surgeon: Vena Austria, MD;  Location: [facility] ORS;  Service: Gynecology;;    Current Outpatient Medications:    Semaglutide-Weight Management (WEGOVY) 2.4 MG/0.75ML SOAJ, Inject 2.4 mg into the skin once a week., Disp: 3 mL, Rfl: 3  Allergies  Allergen Reactions   Shellfish Allergy Shortness Of Breath and Swelling    CRAB AND LOBSTER   Review of Systems Objective:  There were no vitals filed for this visit.  General: Well developed, nourished, in no acute distress, alert and oriented x3   Dermatological: Skin is warm, dry and supple bilateral. Nails x 10 are well maintained; remaining integument appears unremarkable at this time. There are no  open sores, no preulcerative lesions, no rash or signs of infection present.  Solitary benign punctated lesions plantar aspect of the fifth metatarsal left foot.  Vascular: Dorsalis Pedis artery and Posterior Tibial artery pedal pulses are 2/4 bilateral with immedate capillary fill time. Pedal hair growth present. No varicosities and no lower extremity edema present bilateral.   Neruologic: Grossly intact via light touch bilateral. Vibratory intact via tuning fork bilateral. Protective threshold with Semmes Wienstein monofilament intact to all pedal sites bilateral. Patellar and Achilles deep tendon reflexes 2+ bilateral. No Babinski or clonus noted bilateral.   Musculoskeletal: No gross boney pedal deformities bilateral. No pain, crepitus, or limitation noted with foot and ankle range of motion bilateral. Muscular strength 5/5 in all groups tested bilateral.  Underlying bursitis subfifth metatarsal head left foot.  Mildly tender on medial-lateral compression direct palpation.  Gait: Unassisted, Nonantalgic.    Radiographs:  None taken  Assessment & Plan:   Assessment: Bursitis subfifth left foot with benign skin lesion.  Plan: Injected the bursa today with 2 mg of dexamethasone local anesthetic.  Debrided the benign skin lesion.  Follow-up as needed     Shainna Faux T. [place], [place]

## 2023-09-23 ENCOUNTER — Other Ambulatory Visit: Payer: Self-pay | Admitting: Internal Medicine

## 2023-09-23 DIAGNOSIS — Z6841 Body Mass Index (BMI) 40.0 and over, adult: Secondary | ICD-10-CM

## 2023-10-20 ENCOUNTER — Ambulatory Visit (INDEPENDENT_AMBULATORY_CARE_PROVIDER_SITE_OTHER): Admitting: Internal Medicine

## 2023-10-20 VITALS — BP 112/72 | HR 70 | Ht 63.0 in | Wt 214.4 lb

## 2023-10-20 DIAGNOSIS — R7303 Prediabetes: Secondary | ICD-10-CM

## 2023-10-20 DIAGNOSIS — Z6837 Body mass index (BMI) 37.0-37.9, adult: Secondary | ICD-10-CM

## 2023-10-20 MED ORDER — WEGOVY 2.4 MG/0.75ML ~~LOC~~ SOAJ
2.4000 mg | SUBCUTANEOUS | 5 refills | Status: DC
Start: 2023-10-20 — End: 2024-02-22

## 2023-10-20 NOTE — Patient Instructions (Signed)
 Below you will find the link to the [facility] community research project I mentioned through [employer]. This is a free genetic screening opportunity specifically looking for three conditions: 1. Hereditary breast and ovarian cancer syndrome 2. Lynch syndrome (increased risks for colorectal, endometrial and other cancers) 3. Familial hypercholesterolemia (very high cholesterol) We focus on these three conditions because they can occur in the general population, and if you discover you have one of these conditions, there are specific actions you can take to reduce your risk. If you'd like to learn more, I recommend visiting this link: [email or web address]

## 2023-10-20 NOTE — Progress Notes (Signed)
 Date:  10/20/2023   Name:  Kathryn George   DOB:  [birth year]   MRN:  [id number]   Chief Complaint: Medication Refill (Wegovy  medication )  Diabetes She presents for her follow-up diabetic visit. Diabetes type: prediabetes. Her disease course has been improving. Pertinent negatives for hypoglycemia include no dizziness or headaches. Pertinent negatives for diabetes include no chest pain and no fatigue. Current diabetic treatment includes diet (and Wegovy ).   Weight management - pt is here for weight management follow up.  Started on Wegovy  on 06/2022.  Doing well without side effects.  Starting weight 259 lbs.   Today's weight 214 lbs.  Current diet is low carb and current exercise routine is walking.  Review of Systems  Constitutional:  Negative for chills, fatigue and fever.  Respiratory:  Negative for chest tightness and shortness of breath.   Cardiovascular:  Negative for chest pain.  Gastrointestinal:  Negative for abdominal pain, diarrhea, nausea and vomiting.  Neurological:  Negative for dizziness and headaches.     Lab Results  Component Value Date   NA 135 06/18/2023   K 3.1 (L) 06/18/2023   CO2 24 06/18/2023   GLUCOSE 93 06/18/2023   BUN 7 06/18/2023   CREATININE 0.67 06/18/2023   CALCIUM 8.7 (L) 06/18/2023   EGFR 86 01/19/2023   GFRNONAA >60 06/18/2023   Lab Results  Component Value Date   CHOL 204 (H) 01/19/2023   HDL 52 01/19/2023   LDLCALC 132 (H) 01/19/2023   TRIG 113 01/19/2023   CHOLHDL 3.9 01/19/2023   Lab Results  Component Value Date   TSH 0.872 01/19/2023   Lab Results  Component Value Date   HGBA1C 5.7 (H) 02/04/2022   Lab Results  Component Value Date   WBC 8.2 06/18/2023   HGB 13.0 06/18/2023   HCT 41.3 06/18/2023   MCV 75.8 (L) 06/18/2023   PLT 451 (H) 06/18/2023   Lab Results  Component Value Date   ALT 15 01/19/2023   AST 12 01/19/2023   ALKPHOS 71 01/19/2023   BILITOT 0.3 01/19/2023   Lab Results  Component Value Date    VD25OH 15.8 (L) 02/04/2022     Patient Active Problem List   Diagnosis Date Noted   Encounter for screening colonoscopy    Prediabetes 12/01/2021   BMI 40.0-44.9, adult (HCC) 12/01/2021   Abnormal uterine bleeding (AUB) 09/16/2020   Intramural, submucous, and subserous leiomyoma of uterus 07/31/2020   Microcytic anemia 07/31/2020   Vitamin D  deficiency 10/15/2019   Migraine without aura and without status migrainosus, not intractable 10/15/2019   Sebaceous cyst of skin of left breast 08/25/2018   Hyperlipidemia, mixed 04/17/2017   Menorrhagia with regular cycle 10/04/2016   Environmental and seasonal allergies 10/04/2016    Allergies  Allergen Reactions   Shellfish Allergy Shortness Of Breath and Swelling    CRAB AND LOBSTER    Past Surgical History:  Procedure Laterality Date   COLONOSCOPY WITH PROPOFOL  N/A 04/16/2022   Procedure: COLONOSCOPY WITH PROPOFOL ;  Surgeon: Luke Salaam, MD;  Location: [facility] ENDOSCOPY;  Service: Gastroenterology;  Laterality: N/A;   DILATION AND CURETTAGE OF UTERUS  09/16/2020   Procedure: DILATATION AND CURETTAGE;  Surgeon: Darl Edu, MD;  Location: [facility] ORS;  Service: Gynecology;;   Dionne Frederick  09/16/2020   Procedure: VAGINAL MYOMECTOMY;  Surgeon: Darl Edu, MD;  Location: [facility] ORS;  Service: Gynecology;;    Social History   Tobacco Use   Smoking status: Former    Passive exposure:  Past   Smokeless tobacco: Never  Vaping Use   Vaping status: Never Used  Substance Use Topics   Alcohol use: Yes    Comment: social   Drug use: No     Medication list has been reviewed and updated.  Current Meds  Medication Sig   [DISCONTINUED] Semaglutide -Weight Management (WEGOVY ) 2.4 MG/0.75ML SOAJ Inject 2.4 mg into the skin once a week.       10/20/2023    9:59 AM 05/03/2023    1:54 PM 01/19/2023    1:26 PM 10/05/2022    2:02 PM  GAD 7 : Generalized Anxiety Score  Nervous, Anxious, on Edge 0 0 0 0  Control/stop worrying 1 0 0 0   Worry too much - different things 1 0 1 2  Trouble relaxing 1 0 1 1  Restless 1 0 0 1  Easily annoyed or irritable 0 0 0 1  Afraid - awful might happen 0 0 0 0  Total GAD 7 Score 4 0 2 5  Anxiety Difficulty Somewhat difficult Not difficult at all Not difficult at all Not difficult at all       10/20/2023    9:59 AM 05/03/2023    1:54 PM 01/19/2023    1:26 PM  Depression screen PHQ 2/9  Decreased Interest 1 0 0  Down, Depressed, Hopeless 1 0 0  PHQ - 2 Score 2 0 0  Altered sleeping 1 0 1  Tired, decreased energy 1 0 1  Change in appetite 0 0 0  Feeling bad or failure about yourself  0 0 0  Trouble concentrating 0 0 0  Moving slowly or fidgety/restless 0 0 0  Suicidal thoughts 0 0 0  PHQ-9 Score 4 0 2  Difficult doing work/chores Somewhat difficult Not difficult at all Not difficult at all    BP Readings from Last 3 Encounters:  10/20/23 112/72  06/18/23 (!) 153/98  06/18/23 (!) 161/103    Physical Exam Vitals and nursing note reviewed.  Constitutional:      General: She is not in acute distress.    Appearance: Normal appearance. She is well-developed.  HENT:     Head: Normocephalic and atraumatic.  Cardiovascular:     Rate and Rhythm: Normal rate and regular rhythm.     Heart sounds: No murmur heard. Pulmonary:     Effort: Pulmonary effort is normal. No respiratory distress.     Breath sounds: No wheezing or rhonchi.  Musculoskeletal:     Cervical back: Normal range of motion.     Right lower leg: No edema.     Left lower leg: No edema.  Skin:    General: Skin is warm and dry.     Findings: No rash.  Neurological:     Mental Status: She is alert and oriented to person, place, and time.  Psychiatric:        Mood and Affect: Mood normal.        Behavior: Behavior normal.     Wt Readings from Last 3 Encounters:  10/20/23 214 lb 6 oz (97.2 kg)  06/18/23 220 lb 14.4 oz (100.2 kg)  06/18/23 220 lb 14.4 oz (100.2 kg)    BP 112/72   Pulse 70   Ht 5\' 3"   (1.6 m)   Wt 214 lb 6 oz (97.2 kg)   LMP 01/02/2022 (Approximate)   SpO2 93%   BMI 37.97 kg/m   Assessment and Plan:  Problem List Items Addressed This Visit  Unprioritized   Prediabetes - Primary (Chronic)   Improving with weight loss. Lab Results  Component Value Date   HGBA1C 5.7 (H) 02/04/2022         BMI 40.0-44.9, adult (HCC) (Chronic)   She is doing well on Wegovy  - has lost 35 lbs to date. No side effects noted,  recommend continuing therapy along with exercise and diet modification.      Relevant Medications   Semaglutide -Weight Management (WEGOVY ) 2.4 MG/0.75ML SOAJ    Return in about 4 months (around 02/20/2024) for CPX.    Sheron Dixons, MD [facility] [place]

## 2023-10-20 NOTE — Assessment & Plan Note (Signed)
 She is doing well on Wegovy  - has lost 35 lbs to date. No side effects noted,  recommend continuing therapy along with exercise and diet modification.

## 2023-10-20 NOTE — Assessment & Plan Note (Signed)
 Improving with weight loss. Lab Results  Component Value Date   HGBA1C 5.7 (H) 02/04/2022

## 2023-10-21 ENCOUNTER — Other Ambulatory Visit: Payer: Self-pay | Admitting: Medical Genetics

## 2023-10-29 ENCOUNTER — Other Ambulatory Visit: Payer: Self-pay

## 2023-11-11 ENCOUNTER — Other Ambulatory Visit: Payer: Self-pay

## 2023-11-18 ENCOUNTER — Other Ambulatory Visit
Admission: RE | Admit: 2023-11-18 | Discharge: 2023-11-18 | Disposition: A | Discharge status: Home / Self Care | Payer: Self-pay | Source: Ambulatory Visit | Attending: Medical Genetics | Admitting: Medical Genetics

## 2023-11-19 ENCOUNTER — Other Ambulatory Visit: Payer: Self-pay

## 2024-02-22 ENCOUNTER — Ambulatory Visit (INDEPENDENT_AMBULATORY_CARE_PROVIDER_SITE_OTHER): Admitting: Internal Medicine

## 2024-02-22 VITALS — BP 128/84 | HR 64 | Ht 63.0 in | Wt 222.0 lb

## 2024-02-22 DIAGNOSIS — Z6839 Body mass index (BMI) 39.0-39.9, adult: Secondary | ICD-10-CM

## 2024-02-22 DIAGNOSIS — R7303 Prediabetes: Secondary | ICD-10-CM | POA: Diagnosis not present

## 2024-02-22 DIAGNOSIS — Z1231 Encounter for screening mammogram for malignant neoplasm of breast: Secondary | ICD-10-CM | POA: Diagnosis not present

## 2024-02-22 DIAGNOSIS — Z0000 Encounter for general adult medical examination without abnormal findings: Secondary | ICD-10-CM

## 2024-02-22 DIAGNOSIS — E782 Mixed hyperlipidemia: Secondary | ICD-10-CM | POA: Diagnosis not present

## 2024-02-22 DIAGNOSIS — E559 Vitamin D deficiency, unspecified: Secondary | ICD-10-CM

## 2024-02-22 MED ORDER — WEGOVY 2.4 MG/0.75ML ~~LOC~~ SOAJ: 2.4000 mg | SUBCUTANEOUS | 5 refills | Status: AC

## 2024-02-22 NOTE — Assessment & Plan Note (Signed)
 Lipids managed with diet and exercise. Lab Results  Component Value Date   LDLCALC 132 (H) 01/19/2023

## 2024-02-22 NOTE — Assessment & Plan Note (Signed)
 Controlled with diet and exercise She has gained some weight recently - re-enforced diet changes along with Wegovy 

## 2024-02-22 NOTE — Patient Instructions (Signed)
 Start a Vitamin D  supplement daily - 25 mcg

## 2024-02-22 NOTE — Assessment & Plan Note (Signed)
 On Wegovy  2.4 mg but with some recent weight gain Discussed diet changes and regular exercise.

## 2024-02-22 NOTE — Progress Notes (Signed)
 Date:  02/22/2024   Name:  Kathryn George   DOB:  Apr 26, 1976   MRN:  [id number]   Chief Complaint: Annual Exam Kathryn George is a 49 y.o. female who presents today for her Complete Annual Exam. She feels well. She reports exercising. She reports she is sleeping fairly well. Breast complaints - none.  Health Maintenance  Topic Date Due   DTaP/Tdap/Td vaccine (1 - Tdap) Never done   Hepatitis B Vaccine (1 of 3 - 19+ 3-dose series) Never done   Pneumococcal Vaccine for age over 77 (1 of 1 - PCV) Never done   COVID-19 Vaccine (4 - 2025-26 season) 02/20/2024   Zoster (Shingles) Vaccine (1 of 2) 05/23/2024*   Mammogram  05/25/2024   Pap with HPV screening  02/05/2027   Colon Cancer Screening  04/16/2032   Hepatitis C Screening  Completed   HIV Screening  Completed   HPV Vaccine  Aged Out   Meningitis B Vaccine  Aged Out   Flu Shot  Discontinued  *Topic was postponed. The date shown is not the original due date.    HPI  Review of Systems  Constitutional:  Negative for fatigue and unexpected weight change.  HENT:  Negative for trouble swallowing.   Eyes:  Negative for visual disturbance.  Respiratory:  Negative for cough, chest tightness, shortness of breath and wheezing.   Cardiovascular:  Negative for chest pain, palpitations and leg swelling.  Gastrointestinal:  Negative for abdominal pain, constipation and diarrhea.  Musculoskeletal:  Positive for back pain (mild left sided back pain). Negative for arthralgias and myalgias.  Neurological:  Negative for dizziness, weakness, light-headedness and headaches.     Lab Results  Component Value Date   NA 135 06/18/2023   K 3.1 (L) 06/18/2023   CO2 24 06/18/2023   GLUCOSE 93 06/18/2023   BUN 7 06/18/2023   CREATININE 0.67 06/18/2023   CALCIUM 8.7 (L) 06/18/2023   EGFR 86 01/19/2023   GFRNONAA >60 06/18/2023   Lab Results  Component Value Date   CHOL 204 (H) 01/19/2023   HDL 52 01/19/2023   LDLCALC 132 (H)  01/19/2023   TRIG 113 01/19/2023   CHOLHDL 3.9 01/19/2023   Lab Results  Component Value Date   TSH 0.872 01/19/2023   Lab Results  Component Value Date   HGBA1C 5.7 (H) 02/04/2022   Lab Results  Component Value Date   WBC 8.2 06/18/2023   HGB 13.0 06/18/2023   HCT 41.3 06/18/2023   MCV 75.8 (L) 06/18/2023   PLT 451 (H) 06/18/2023   Lab Results  Component Value Date   ALT 15 01/19/2023   AST 12 01/19/2023   ALKPHOS 71 01/19/2023   BILITOT 0.3 01/19/2023   Lab Results  Component Value Date   VD25OH 15.8 (L) 02/04/2022     Patient Active Problem List   Diagnosis Date Noted   Encounter for screening colonoscopy    Prediabetes 12/01/2021   BMI 40.0-44.9, adult (HCC) 12/01/2021   Abnormal uterine bleeding (AUB) 09/16/2020   Intramural, submucous, and subserous leiomyoma of uterus 07/31/2020   Microcytic anemia 07/31/2020   Vitamin D  deficiency 10/15/2019   Migraine without aura and without status migrainosus, not intractable 10/15/2019   Sebaceous cyst of skin of left breast 08/25/2018   Hyperlipidemia, mixed 04/17/2017   Menorrhagia with regular cycle 10/04/2016   Environmental and seasonal allergies 10/04/2016    Allergies  Allergen Reactions   Shellfish Allergy Shortness Of Breath and Swelling  CRAB AND LOBSTER    Past Surgical History:  Procedure Laterality Date   COLONOSCOPY WITH PROPOFOL  N/A 04/16/2022   Procedure: COLONOSCOPY WITH PROPOFOL ;  Surgeon: Therisa Bi, MD;  Location: [facility] ENDOSCOPY;  Service: Gastroenterology;  Laterality: N/A;   DILATION AND CURETTAGE OF UTERUS  09/16/2020   Procedure: DILATATION AND CURETTAGE;  Surgeon: Lake Read, MD;  Location: [facility] ORS;  Service: Gynecology;;   LEANORA  09/16/2020   Procedure: VAGINAL MYOMECTOMY;  Surgeon: Lake Read, MD;  Location: [facility] ORS;  Service: Gynecology;;    Social History   Tobacco Use   Smoking status: Former    Passive exposure: Past   Smokeless tobacco: Never   Vaping Use   Vaping status: Never Used  Substance Use Topics   Alcohol use: Yes    Comment: social   Drug use: No     Medication list has been reviewed and updated.  Current Meds  Medication Sig   [DISCONTINUED] semaglutide -weight management (WEGOVY ) 1.7 MG/0.75ML SOAJ SQ injection Inject 1.7 mg into the skin once a week.   [DISCONTINUED] Semaglutide -Weight Management (WEGOVY ) 2.4 MG/0.75ML SOAJ Inject 2.4 mg into the skin once a week. (Patient taking differently: Inject 1.7 mg into the skin once a week.)       02/22/2024    2:51 PM 10/20/2023    9:59 AM 05/03/2023    1:54 PM 01/19/2023    1:26 PM  GAD 7 : Generalized Anxiety Score  Nervous, Anxious, on Edge 0 0 0 0  Control/stop worrying 0 1 0 0  Worry too much - different things 0 1 0 1  Trouble relaxing 0 1 0 1  Restless 0 1 0 0  Easily annoyed or irritable 0 0 0 0  Afraid - awful might happen 0 0 0 0  Total GAD 7 Score 0 4 0 2  Anxiety Difficulty Not difficult at all Somewhat difficult Not difficult at all Not difficult at all       02/22/2024    2:51 PM 10/20/2023    9:59 AM 05/03/2023    1:54 PM  Depression screen PHQ 2/9  Decreased Interest 0 1 0  Down, Depressed, Hopeless 0 1 0  PHQ - 2 Score 0 2 0  Altered sleeping 0 1 0  Tired, decreased energy 0 1 0  Change in appetite 0 0 0  Feeling bad or failure about yourself  0 0 0  Trouble concentrating 0 0 0  Moving slowly or fidgety/restless 0 0 0  Suicidal thoughts 0 0 0  PHQ-9 Score 0 4 0  Difficult doing work/chores Not difficult at all Somewhat difficult Not difficult at all    BP Readings from Last 3 Encounters:  02/22/24 128/84  10/20/23 112/72  06/18/23 (!) 153/98    Physical Exam Vitals and nursing note reviewed.  Constitutional:      General: She is not in acute distress.    Appearance: She is well-developed.  HENT:     Head: Normocephalic and atraumatic.     Right Ear: Tympanic membrane and ear canal normal.     Left Ear: Tympanic membrane and  ear canal normal.     Nose:     Right Sinus: No maxillary sinus tenderness.     Left Sinus: No maxillary sinus tenderness.  Eyes:     General: No scleral icterus.       Right eye: No discharge.        Left eye: No discharge.     Conjunctiva/sclera:  Conjunctivae normal.  Neck:     Thyroid: No thyromegaly.     Vascular: No carotid bruit.  Cardiovascular:     Rate and Rhythm: Normal rate and regular rhythm.     Pulses: Normal pulses.     Heart sounds: Normal heart sounds.  Pulmonary:     Effort: Pulmonary effort is normal. No respiratory distress.     Breath sounds: No wheezing.  Abdominal:     General: Bowel sounds are normal.     Palpations: Abdomen is soft.     Tenderness: There is no abdominal tenderness.  Musculoskeletal:     Cervical back: Normal range of motion. No erythema.       Back:     Right lower leg: No edema.     Left lower leg: No edema.     Comments: Muscular spasm  Lymphadenopathy:     Cervical: No cervical adenopathy.  Skin:    General: Skin is warm and dry.     Findings: No rash.  Neurological:     Mental Status: She is alert and oriented to person, place, and time.     Cranial Nerves: No cranial nerve deficit.     Sensory: No sensory deficit.     Deep Tendon Reflexes: Reflexes are normal and symmetric.  Psychiatric:        Attention and Perception: Attention normal.        Mood and Affect: Mood normal.     Wt Readings from Last 3 Encounters:  02/22/24 222 lb (100.7 kg)  10/20/23 214 lb 6 oz (97.2 kg)  06/18/23 220 lb 14.4 oz (100.2 kg)    BP 128/84   Pulse 64   Ht 5' 3 (1.6 m)   Wt 222 lb (100.7 kg)   LMP 01/02/2022 (Approximate)   SpO2 97%   BMI 39.33 kg/m   Assessment and Plan:  Problem List Items Addressed This Visit       Unprioritized   Hyperlipidemia, mixed (Chronic)   Lipids managed with diet and exercise. Lab Results  Component Value Date   LDLCALC 132 (H) 01/19/2023         Relevant Orders   Lipid panel   TSH    Vitamin D  deficiency (Chronic)   Recommend vitamin D  daily 1000 international units      Relevant Orders   VITAMIN D  25 Hydroxy (Vit-D Deficiency, Fractures)   Prediabetes (Chronic)   Controlled with diet and exercise She has gained some weight recently - re-enforced diet changes along with Wegovy       Relevant Orders   Comprehensive metabolic panel with GFR   Hemoglobin A1c   BMI 40.0-44.9, adult (HCC) (Chronic)   On Wegovy  2.4 mg but with some recent weight gain Discussed diet changes and regular exercise.      Relevant Medications   semaglutide -weight management (WEGOVY ) 2.4 MG/0.75ML SOAJ SQ injection   Other Visit Diagnoses       Annual physical exam    -  Primary   Normal exam except for weight. declines shingrix and Tdap today   Relevant Orders   CBC with Differential/Platelet   Comprehensive metabolic panel with GFR   Hemoglobin A1c   Lipid panel   TSH     Encounter for screening mammogram for breast cancer       will give order for [facility]       No follow-ups on file.    Leita HILARIO Adie, MD [facility]  [place]

## 2024-02-22 NOTE — Assessment & Plan Note (Signed)
 Recommend vitamin D  daily 1000 international units

## 2024-02-23 ENCOUNTER — Ambulatory Visit: Payer: Self-pay | Admitting: Internal Medicine

## 2024-02-23 LAB — COMPREHENSIVE METABOLIC PANEL WITH GFR
ALT: 19 IU/L (ref 0–32)
AST: 22 IU/L (ref 0–40)
Albumin: 4.2 g/dL (ref 3.9–4.9)
Alkaline Phosphatase: 67 IU/L (ref 44–121)
BUN/Creatinine Ratio: 14 (ref 9–23)
BUN: 12 mg/dL (ref 6–24)
Bilirubin Total: 0.3 mg/dL (ref 0.0–1.2)
CO2: 23 mmol/L (ref 20–29)
Calcium: 9.4 mg/dL (ref 8.7–10.2)
Chloride: 103 mmol/L (ref 96–106)
Creatinine, Ser: 0.84 mg/dL (ref 0.57–1.00)
Globulin, Total: 3.1 g/dL (ref 1.5–4.5)
Glucose: 88 mg/dL (ref 70–99)
Potassium: 4.6 mmol/L (ref 3.5–5.2)
Sodium: 139 mmol/L (ref 134–144)
Total Protein: 7.3 g/dL (ref 6.0–8.5)
eGFR: 85 mL/min/1.73 (ref >59)

## 2024-02-23 LAB — LIPID PANEL
Chol/HDL Ratio: 3.8 ratio (ref 0.0–4.4)
Cholesterol, Total: 219 mg/dL — ABNORMAL HIGH (ref 100–199)
HDL: 58 mg/dL (ref >39)
LDL Chol Calc (NIH): 138 mg/dL — ABNORMAL HIGH (ref 0–99)
Triglycerides: 132 mg/dL (ref 0–149)
VLDL Cholesterol Cal: 23 mg/dL (ref 5–40)

## 2024-02-23 LAB — CBC WITH DIFFERENTIAL/PLATELET
Basophils Absolute: 0.1 x10E3/uL (ref 0.0–0.2)
Basos: 1 %
EOS (ABSOLUTE): 0.1 x10E3/uL (ref 0.0–0.4)
Eos: 2 %
Hematocrit: 39.1 % (ref 34.0–46.6)
Hemoglobin: 12 g/dL (ref 11.1–15.9)
Immature Grans (Abs): 0 x10E3/uL (ref 0.0–0.1)
Immature Granulocytes: 0 %
Lymphocytes Absolute: 1.8 x10E3/uL (ref 0.7–3.1)
Lymphs: 36 %
MCH: 24.6 pg — ABNORMAL LOW (ref 26.6–33.0)
MCHC: 30.7 g/dL — ABNORMAL LOW (ref 31.5–35.7)
MCV: 80 fL (ref 79–97)
Monocytes Absolute: 0.5 x10E3/uL (ref 0.1–0.9)
Monocytes: 11 %
Neutrophils Absolute: 2.5 x10E3/uL (ref 1.4–7.0)
Neutrophils: 50 %
Platelets: 293 x10E3/uL (ref 150–450)
RBC: 4.88 x10E6/uL (ref 3.77–5.28)
RDW: 14.6 % (ref 11.7–15.4)
WBC: 4.9 x10E3/uL (ref 3.4–10.8)

## 2024-02-23 LAB — HEMOGLOBIN A1C
Est. average glucose Bld gHb Est-mCnc: 105 mg/dL
Hgb A1c MFr Bld: 5.3 % (ref 4.8–5.6)

## 2024-02-23 LAB — TSH: TSH: 1.56 u[IU]/mL (ref 0.450–4.500)

## 2024-02-23 LAB — VITAMIN D 25 HYDROXY (VIT D DEFICIENCY, FRACTURES): Vit D, 25-Hydroxy: 14.9 ng/mL — ABNORMAL LOW (ref 30.0–100.0)

## 2024-03-29 ENCOUNTER — Encounter: Payer: Self-pay | Admitting: Internal Medicine

## 2024-03-29 NOTE — Telephone Encounter (Signed)
 Please review and advise.  JM

## 2024-05-05 ENCOUNTER — Encounter: Payer: Self-pay | Admitting: Internal Medicine

## 2024-05-07 ENCOUNTER — Telehealth: Payer: Self-pay

## 2024-05-07 ENCOUNTER — Other Ambulatory Visit (HOSPITAL_COMMUNITY): Payer: Self-pay

## 2024-05-07 ENCOUNTER — Telehealth: Payer: Self-pay | Admitting: Pharmacy Technician

## 2024-05-07 NOTE — Telephone Encounter (Signed)
 Approval from 05/07/2024-05/07/2025

## 2024-05-07 NOTE — Telephone Encounter (Signed)
 PA request has been Received. New Encounter has been or will be created for follow up. For additional info see Pharmacy Prior Auth telephone encounter from 05/07/24.

## 2024-05-07 NOTE — Telephone Encounter (Signed)
 Pharmacy Patient Advocate Encounter   Received notification from Pt Calls Messages that prior authorization for Wegovy  2.4MG /0.75ML auto-injectors is required/requested.   Insurance verification completed.   The patient is insured through [employer].   Per test claim: PA required; PA started via CoverMyMeds. KEY [id number] . Waiting for clinical questions to populate.

## 2024-05-07 NOTE — Telephone Encounter (Signed)
 Please complete PA for Wegovy .  KP

## 2024-05-08 NOTE — Telephone Encounter (Signed)
 Approval from 05/07/2024-05/07/2025   KP

## 2024-05-28 LAB — HM MAMMOGRAPHY

## 2024-08-21 ENCOUNTER — Ambulatory Visit: Admitting: Student
